# Patient Record
Sex: Female | Born: 1960 | Hispanic: No | Marital: Married | Smoking: Never smoker
Health system: Southern US, Community
[De-identification: ages and names within clinical notes are randomized; demographics above are authoritative.]

## PROBLEM LIST (undated history)

## (undated) DIAGNOSIS — E785 Hyperlipidemia, unspecified: Secondary | ICD-10-CM

## (undated) DIAGNOSIS — I10 Essential (primary) hypertension: Secondary | ICD-10-CM

## (undated) HISTORY — DX: Hyperlipidemia, unspecified: E78.5

## (undated) HISTORY — PX: ENDOMETRIAL ABLATION: SHX621

## (undated) HISTORY — PX: DILATION AND CURETTAGE OF UTERUS: SHX78

## (undated) HISTORY — PX: PLACEMENT OF BREAST IMPLANTS: SHX6334

## (undated) HISTORY — DX: Essential (primary) hypertension: I10

---

## 1999-11-19 ENCOUNTER — Other Ambulatory Visit: Admission: RE | Admit: 1999-11-19 | Discharge: 1999-11-19 | Payer: Self-pay | Admitting: Family Medicine

## 2000-03-04 ENCOUNTER — Encounter: Payer: Self-pay | Admitting: Family Medicine

## 2000-03-04 ENCOUNTER — Encounter: Admission: RE | Admit: 2000-03-04 | Discharge: 2000-03-04 | Payer: Self-pay | Admitting: Family Medicine

## 2001-08-10 ENCOUNTER — Other Ambulatory Visit: Admission: RE | Admit: 2001-08-10 | Discharge: 2001-08-10 | Payer: Self-pay | Admitting: Family Medicine

## 2002-09-16 ENCOUNTER — Other Ambulatory Visit: Admission: RE | Admit: 2002-09-16 | Discharge: 2002-09-16 | Payer: Self-pay | Admitting: Family Medicine

## 2008-08-27 ENCOUNTER — Ambulatory Visit (HOSPITAL_COMMUNITY): Admission: RE | Admit: 2008-08-27 | Discharge: 2008-08-27 | Payer: Self-pay | Admitting: Orthopedic Surgery

## 2019-06-22 ENCOUNTER — Encounter: Payer: Self-pay | Admitting: Orthopedic Surgery

## 2019-06-22 ENCOUNTER — Ambulatory Visit: Payer: Self-pay

## 2019-06-22 ENCOUNTER — Ambulatory Visit (INDEPENDENT_AMBULATORY_CARE_PROVIDER_SITE_OTHER): Payer: BLUE CROSS/BLUE SHIELD | Admitting: Orthopedic Surgery

## 2019-06-22 DIAGNOSIS — M5412 Radiculopathy, cervical region: Secondary | ICD-10-CM

## 2019-06-22 MED ORDER — PREDNISONE 5 MG (21) PO TBPK: ORAL_TABLET | ORAL | 0 refills | Status: DC

## 2019-06-22 MED ORDER — METHOCARBAMOL 500 MG PO TABS: 500.0000 mg | ORAL_TABLET | Freq: Three times a day (TID) | ORAL | 0 refills | Status: DC | PRN

## 2019-06-23 ENCOUNTER — Encounter: Payer: Self-pay | Admitting: Orthopedic Surgery

## 2019-06-23 ENCOUNTER — Telehealth: Payer: Self-pay | Admitting: Orthopedic Surgery

## 2019-06-23 NOTE — Progress Notes (Signed)
Office Visit Note   Patient: Barbara Glenn A Rollyson           Date of Birth: 05/29/1961           MRN: [id number] Visit Date: 06/22/2019 Requested by: No referring provider defined for this encounter. PCP: System, Pcp Not In  Subjective: Chief Complaint  Patient presents with  . Neck - Pain    HPI: Barbara Glenn is a patient with neck and shoulder pain.  Been going on for about a month.  Denies any history of injury.  She reports radicular arm pain on the left-hand side.  She is right-hand dominant.  The pain does wake her from sleep at night.  She had the same symptoms about 6 years ago.  That MRI scan of her cervical spine is unavailable.  That did improve with physical therapy and Mobic.  Denies any decreased range of motion.  She does describe relief with raising her left arm over her head.  Takes Aleve as needed.  She has had 5 visits with physical therapy.  She cannot exercise.              ROS: All systems reviewed are negative as they relate to the chief complaint within the history of present illness.  Patient denies  fevers or chills.   Assessment & Plan: Visit Diagnoses:  1. Cervical radiculopathy     Plan: Impression is recurrent left-sided radiculopathy.  Similar problem 6 years ago resolved with conservative treatment.  No weakness today but does have classic radicular symptoms.  Degenerative changes present in the cervical spine.  Plan Medrol Dosepak 6-day course with Robaxin as well.  I like her to call me in 2 weeks and we could consider further imaging at that time if she develops weakness or intractable pain.  She does have very slight weakness on finger extension and shoulder external rotation on the left versus right but no paresthesias C5-T1.  Follow-Up Instructions: Return if symptoms worsen or fail to improve.   Orders:  Orders Placed This Encounter  Procedures  . XR Cervical Spine 2 or 3 views   Meds ordered this encounter  Medications  . predniSONE (STERAPRED UNI-PAK 21  TAB) 5 MG (21) TBPK tablet    Sig: Take dosepak as directed    Dispense:  21 tablet    Refill:  0  . methocarbamol (ROBAXIN) 500 MG tablet    Sig: Take 1 tablet (500 mg total) by mouth every 8 (eight) hours as needed for muscle spasms.    Dispense:  30 tablet    Refill:  0      Procedures: No procedures performed   Clinical Data: No additional findings.  Objective: Vital Signs: There were no vitals taken for this visit.  Physical Exam:   Constitutional: Patient appears well-developed HEENT:  Head: Normocephalic Eyes:EOM are normal Neck: Normal range of motion Cardiovascular: Normal rate Pulmonary/chest: Effort normal Neurologic: Patient is alert Skin: Skin is warm Psychiatric: Patient has normal mood and affect    Ortho Exam: Ortho exam demonstrates normal gait alignment.  Palpable radial pulses bilaterally.  Pretty good cervical spine range of motion but she does have some pain with rotation to the left which radiates into the trapezial region.  Excellent shoulder range of motion with slight 5- out of 5 weakness to external rotation on the right compared to the left.  No other masses lymphadenopathy or skin changes noted in that shoulder girdle region.  Slight weakness to finger extension on  the left compared to the right.  No muscle atrophy left arm versus right.  Specialty Comments:  No specialty comments available.  Imaging: No results found.   PMFS History: There are no active problems to display for this patient.  History reviewed. No pertinent past medical history.  History reviewed. No pertinent family history.  History reviewed. No pertinent surgical history. Social History   Occupational History  . Not on file  Tobacco Use  . Smoking status: Not on file  Substance and Sexual Activity  . Alcohol use: Not on file  . Drug use: Not on file  . Sexual activity: Not on file

## 2019-06-23 NOTE — Telephone Encounter (Signed)
Pt called in said he had a question for dr.dean said should she continue doing physical therapy.   [phone]

## 2019-06-23 NOTE — Telephone Encounter (Signed)
Okay to start therapy 3 days after finishing the Medrol Dosepak please call thanks also please remind her to call me in 2 weeks to let me know if she is better and if she is then we can continue if she is not then we will get MRI scan on cervical spine.  All this was discussed in the clinic visit but I am not sure if she remembers it.

## 2019-06-23 NOTE — Telephone Encounter (Signed)
Patient asking if she should continue with therapy. Please advise. Thanks.

## 2019-06-24 NOTE — Telephone Encounter (Signed)
Tried calling. No answer. LMVM advising per Dr Dean.  

## 2019-07-07 ENCOUNTER — Telehealth: Payer: Self-pay | Admitting: Orthopedic Surgery

## 2019-07-07 DIAGNOSIS — M542 Cervicalgia: Secondary | ICD-10-CM

## 2019-07-07 NOTE — Telephone Encounter (Signed)
Pt called in said dr.dean told her to call in if she wasn't feeling better and pt says she is still in pain on left shoulder/upper back.  Please give her a call she would like to know what she could do next.  [phone]

## 2019-07-07 NOTE — Telephone Encounter (Signed)
Please advise. Last note said potentially discuss further imaging.

## 2019-07-08 MED ORDER — METHOCARBAMOL 500 MG PO TABS: 500.0000 mg | ORAL_TABLET | Freq: Three times a day (TID) | ORAL | 0 refills | Status: DC | PRN

## 2019-07-08 NOTE — Telephone Encounter (Signed)
I sent in refill for robaxin. She was out. She asked for refill on prednisone also but advised typically do not refill that, especially close together.

## 2019-07-08 NOTE — Addendum Note (Signed)
Addended byLaurann Montana on: 07/08/2019 01:15 PM   Modules accepted: Orders

## 2019-07-08 NOTE — Telephone Encounter (Signed)
Needs mri c spine p[ls call thx

## 2019-07-11 NOTE — Telephone Encounter (Signed)
No rf on predinisone thx

## 2019-07-15 ENCOUNTER — Other Ambulatory Visit: Payer: Self-pay

## 2019-07-15 ENCOUNTER — Ambulatory Visit
Admission: RE | Admit: 2019-07-15 | Discharge: 2019-07-15 | Disposition: A | Discharge status: Home / Self Care | Payer: BLUE CROSS/BLUE SHIELD | Source: Ambulatory Visit | Attending: Orthopedic Surgery | Admitting: Orthopedic Surgery

## 2019-07-15 DIAGNOSIS — M542 Cervicalgia: Secondary | ICD-10-CM

## 2019-07-19 ENCOUNTER — Telehealth: Payer: Self-pay | Admitting: Orthopedic Surgery

## 2019-07-19 NOTE — Telephone Encounter (Signed)
Called patient left message to return call to schedule an appointment for MRI review 

## 2019-07-28 ENCOUNTER — Ambulatory Visit (INDEPENDENT_AMBULATORY_CARE_PROVIDER_SITE_OTHER): Payer: BLUE CROSS/BLUE SHIELD | Admitting: Orthopedic Surgery

## 2019-07-28 ENCOUNTER — Encounter: Payer: Self-pay | Admitting: Orthopedic Surgery

## 2019-07-28 DIAGNOSIS — M792 Neuralgia and neuritis, unspecified: Secondary | ICD-10-CM | POA: Diagnosis not present

## 2019-07-29 ENCOUNTER — Telehealth: Payer: Self-pay | Admitting: *Deleted

## 2019-07-29 ENCOUNTER — Encounter: Payer: Self-pay | Admitting: Orthopedic Surgery

## 2019-07-29 NOTE — Progress Notes (Signed)
   Office Visit Note   Patient: Barbara Glenn           Date of Birth: 07/07/1961           MRN: [id number] Visit Date: 07/28/2019 Requested by: No referring provider defined for this encounter. PCP: System, Pcp Not In  Subjective: Chief Complaint  Patient presents with  . Follow-up    HPI: Reis is a patient here for review of MRI scan.  Prednisone taper did help her.  She is been in therapy doing dry needling and neck stretching.  MRI scan shows fairly significant C5-6 bilateral foraminal narrowing as well as a disc herniation.  C6-7 has smaller disc herniation.  Scans are reviewed with the patient.  Patient will have pain which occasionally wakes her up at night.  Really hard for her to exercise or walk.  With the arm down on the left her symptoms are worse with the arm up it is better.              ROS: All systems reviewed are negative as they relate to the chief complaint within the history of present illness.  Patient denies  fevers or chills.   Assessment & Plan: Visit Diagnoses:  1. Radicular pain in left arm     Plan: Impression is cervical spine radiculopathy with 1 level definitively involved possible second level partially involved plan is referral to Dr. Ernestina Patches for C-spine ESI.  I think she is failed conservative measures.  She is had this for several years and did Well with therapy before.  Symptoms have recurred.  Shots would be a good way to buy some time to see if the disc in become smaller  Follow-Up Instructions: No follow-ups on file.   Orders:  Orders Placed This Encounter  Procedures  . Ambulatory referral to Physical Medicine Rehab   No orders of the defined types were placed in this encounter.     Procedures: No procedures performed   Clinical Data: No additional findings.  Objective: Vital Signs: There were no vitals taken for this visit.  Physical Exam:   Constitutional: Patient appears well-developed HEENT:  Head: Normocephalic Eyes:EOM  are normal Neck: Normal range of motion Cardiovascular: Normal rate Pulmonary/chest: Effort normal Neurologic: Patient is alert Skin: Skin is warm Psychiatric: Patient has normal mood and affect    Ortho Exam: Ortho exam demonstrates full active and passive range of motion of the neck.  She has 5 out of 5 grip EPL FPL interosseous wrist flexion extension bicep triceps and deltoid strength.  Radial pulse intact bilaterally.  No real weakness in the arm.  Shoulder exam on the left is normal.  No paresthesias C5-T1.  Specialty Comments:  No specialty comments available.  Imaging: No results found.   PMFS History: There are no active problems to display for this patient.  History reviewed. No pertinent past medical history.  History reviewed. No pertinent family history.  History reviewed. No pertinent surgical history. Social History   Occupational History  . Not on file  Tobacco Use  . Smoking status: Never Smoker  . Smokeless tobacco: Never Used  Substance and Sexual Activity  . Alcohol use: Not on file  . Drug use: Not on file  . Sexual activity: Not on file

## 2019-08-01 ENCOUNTER — Ambulatory Visit: Payer: BLUE CROSS/BLUE SHIELD | Admitting: Orthopedic Surgery

## 2019-08-03 ENCOUNTER — Other Ambulatory Visit: Payer: Self-pay | Admitting: Physical Medicine and Rehabilitation

## 2019-08-03 DIAGNOSIS — F411 Generalized anxiety disorder: Secondary | ICD-10-CM

## 2019-08-03 MED ORDER — DIAZEPAM 5 MG PO TABS: ORAL_TABLET | ORAL | 0 refills | Status: DC

## 2019-08-03 NOTE — Telephone Encounter (Signed)
done

## 2019-08-03 NOTE — Progress Notes (Signed)
Pre-procedure diazepam ordered for pre-operative anxiety.  

## 2019-08-11 ENCOUNTER — Telehealth: Payer: Self-pay | Admitting: Orthopedic Surgery

## 2019-08-11 ENCOUNTER — Other Ambulatory Visit: Payer: Self-pay | Admitting: Surgical

## 2019-08-11 MED ORDER — METHOCARBAMOL 500 MG PO TABS: 500.0000 mg | ORAL_TABLET | Freq: Three times a day (TID) | ORAL | 0 refills | Status: DC | PRN

## 2019-08-11 NOTE — Telephone Encounter (Signed)
Can you advise please  

## 2019-08-11 NOTE — Telephone Encounter (Signed)
Patient called requesting an RX refill on her Methocarbamol.  Patient uses [facility] at the corner of [address].  CB[phone].  Thank you.

## 2019-08-24 ENCOUNTER — Encounter: Payer: BLUE CROSS/BLUE SHIELD | Admitting: Physical Medicine and Rehabilitation

## 2019-09-01 ENCOUNTER — Ambulatory Visit: Payer: Self-pay

## 2019-09-01 ENCOUNTER — Ambulatory Visit (INDEPENDENT_AMBULATORY_CARE_PROVIDER_SITE_OTHER): Payer: BLUE CROSS/BLUE SHIELD | Admitting: Physical Medicine and Rehabilitation

## 2019-09-01 ENCOUNTER — Encounter: Payer: Self-pay | Admitting: Physical Medicine and Rehabilitation

## 2019-09-01 ENCOUNTER — Other Ambulatory Visit: Payer: Self-pay

## 2019-09-01 VITALS — BP 136/84 | HR 76

## 2019-09-01 DIAGNOSIS — M5412 Radiculopathy, cervical region: Secondary | ICD-10-CM

## 2019-09-01 MED ORDER — METHYLPREDNISOLONE ACETATE 80 MG/ML IJ SUSP
80.0000 mg | Freq: Once | INTRAMUSCULAR | Status: AC
  Administered 2019-09-01: 80 mg

## 2019-09-01 NOTE — Progress Notes (Signed)
 .  Numeric Pain Rating Scale and Functional Assessment Average Pain 5   In the last MONTH (on 0-10 scale) has pain interfered with the following?  1. General activity like being  able to carry out your everyday physical activities such as walking, climbing stairs, carrying groceries, or moving a chair?  Rating(6)   +Driver, -BT, -Dye Allergies.  

## 2019-09-19 ENCOUNTER — Other Ambulatory Visit: Payer: Self-pay | Admitting: Surgical

## 2019-09-19 ENCOUNTER — Telehealth: Payer: Self-pay | Admitting: Orthopedic Surgery

## 2019-09-19 NOTE — Telephone Encounter (Signed)
Please advise. Thanks.  

## 2019-09-19 NOTE — Telephone Encounter (Signed)
Patient called to get refill of Methcarbamol.  Please call patient @ [phone]

## 2019-09-20 ENCOUNTER — Other Ambulatory Visit: Payer: Self-pay | Admitting: Surgical

## 2019-09-20 MED ORDER — METHOCARBAMOL 500 MG PO TABS: 500.0000 mg | ORAL_TABLET | Freq: Three times a day (TID) | ORAL | 0 refills | Status: DC | PRN

## 2019-09-20 NOTE — Telephone Encounter (Signed)
IC s/w patient and advised  

## 2019-11-21 NOTE — Procedures (Signed)
Cervical Epidural Steroid Injection - Interlaminar Approach with Fluoroscopic Guidance  Patient: Barbara Glenn      Date of Birth: [birth year] MRN: [id number] PCP: System, Pcp Not In      Visit Date: 09/01/2019   Universal Protocol:    Date/Time: 11/20/2110:42 PM  Consent Given By: the patient  Position: PRONE  Additional Comments: Vital signs were monitored before and after the procedure. Patient was prepped and draped in the usual sterile fashion. The correct patient, procedure, and site was verified.   Injection Procedure Details:  Procedure Site One Meds Administered:  Meds ordered this encounter  Medications  . methylPREDNISolone acetate (DEPO-MEDROL) injection 80 mg     Laterality: Left  Location/Site: C7-T1  Needle size: 20 G  Needle type: Touhy  Needle Placement: Paramedian epidural space  Findings:  -Comments: Excellent flow of contrast into the epidural space.  Procedure Details: Using a paramedian approach from the side mentioned above, the region overlying the inferior lamina was localized under fluoroscopic visualization and the soft tissues overlying this structure were infiltrated with 4 ml. of 1% Lidocaine without Epinephrine. A # 20 gauge, Tuohy needle was inserted into the epidural space using a paramedian approach.  The epidural space was localized using loss of resistance along with lateral and contralateral oblique bi-planar fluoroscopic views.  After negative aspirate for air, blood, and CSF, a 2 ml. volume of Isovue-250 was injected into the epidural space and the flow of contrast was observed. Radiographs were obtained for documentation purposes.   The injectate was administered into the level noted above.  Additional Comments:  The patient tolerated the procedure well Dressing: 2 x 2 sterile gauze and Band-Aid    Post-procedure details: Patient was observed during the procedure. Post-procedure instructions were reviewed.  Patient left  the clinic in stable condition.

## 2019-11-21 NOTE — Progress Notes (Signed)
Barbara Glenn - 59 y.o. female MRN [id number]  Date of birth: [birth year]  Office Visit Note: Visit Date: 09/01/2019 PCP: System, Pcp Not In Referred by: Meredith Pel, MD  Subjective: Chief Complaint  Patient presents with  . Neck - Pain  . Left Shoulder - Pain  . Left Arm - Pain   HPI:  Barbara Glenn is a 59 y.o. female who comes in today At the request of Dr. Anderson Malta for left C7-T1 interlaminar epidural steroid injection.  The patient has failed conservative care including home exercise, medications, time and activity modification.  This injection will be diagnostic and hopefully therapeutic.  Please see requesting physician notes for further details and justification.   ROS Otherwise per HPI.  Assessment & Plan: Visit Diagnoses:  1. Cervical radiculopathy     Plan: No additional findings.   Meds & Orders:  Meds ordered this encounter  Medications  . methylPREDNISolone acetate (DEPO-MEDROL) injection 80 mg    Orders Placed This Encounter  Procedures  . XR C-ARM NO REPORT  . Epidural Steroid injection    Follow-up: Return for visit to requesting physician as needed.   Procedures: No procedures performed  Cervical Epidural Steroid Injection - Interlaminar Approach with Fluoroscopic Guidance  Patient: Barbara Glenn      Date of Birth: October 18, 1961 MRN: [id number] PCP: System, Pcp Not In      Visit Date: 09/01/2019   Universal Protocol:    Date/Time: 11/20/2110:42 PM  Consent Given By: the patient  Position: PRONE  Additional Comments: Vital signs were monitored before and after the procedure. Patient was prepped and draped in the usual sterile fashion. The correct patient, procedure, and site was verified.   Injection Procedure Details:  Procedure Site One Meds Administered:  Meds ordered this encounter  Medications  . methylPREDNISolone acetate (DEPO-MEDROL) injection 80 mg     Laterality: Left  Location/Site: C7-T1  Needle size: 20  G  Needle type: Touhy  Needle Placement: Paramedian epidural space  Findings:  -Comments: Excellent flow of contrast into the epidural space.  Procedure Details: Using a paramedian approach from the side mentioned above, the region overlying the inferior lamina was localized under fluoroscopic visualization and the soft tissues overlying this structure were infiltrated with 4 ml. of 1% Lidocaine without Epinephrine. A # 20 gauge, Tuohy needle was inserted into the epidural space using a paramedian approach.  The epidural space was localized using loss of resistance along with lateral and contralateral oblique bi-planar fluoroscopic views.  After negative aspirate for air, blood, and CSF, a 2 ml. volume of Isovue-250 was injected into the epidural space and the flow of contrast was observed. Radiographs were obtained for documentation purposes.   The injectate was administered into the level noted above.  Additional Comments:  The patient tolerated the procedure well Dressing: 2 x 2 sterile gauze and Band-Aid    Post-procedure details: Patient was observed during the procedure. Post-procedure instructions were reviewed.  Patient left the clinic in stable condition.     Clinical History: MRI CERVICAL SPINE WITHOUT CONTRAST  TECHNIQUE: Multiplanar, multisequence MR imaging of the cervical spine was performed. No intravenous contrast was administered.  COMPARISON:  09/21/2013  FINDINGS: Alignment: Physiologic  Vertebrae: No fracture, evidence of discitis, or bone lesion.  Cord: Normal signal and morphology.  Posterior Fossa, vertebral arteries, paraspinal tissues: Posterior fossa demonstrates no focal abnormality. Vertebral artery flow voids are maintained. Paraspinal soft tissues are unremarkable.  Disc levels:  Discs:  Degenerative disease with disc height loss at C5-6 and C6-7.  C2-3: No significant disc bulge. No neural foraminal stenosis. No central  canal stenosis.  C3-4: No significant disc bulge. Mild bilateral facet arthropathy. No foraminal stenosis. No central canal stenosis.  C4-5: Mild broad-based disc bulge. Mild left foraminal narrowing. No right foraminal narrowing. No central canal stenosis.  C5-6: Broad-based disc osteophyte complex. Bilateral uncovertebral degenerative changes. Severe bilateral foraminal stenosis. Mild spinal stenosis.  C6-7: Broad-based disc bulge. Bilateral uncovertebral degenerative changes. Moderate right and severe left foraminal stenosis. No central canal stenosis.  C7-T1: No significant disc bulge. No neural foraminal stenosis. No central canal stenosis.  IMPRESSION: 1. At C5-6 there is a broad-based disc osteophyte complex. Bilateral uncovertebral degenerative changes. Severe bilateral foraminal stenosis. Mild spinal stenosis. 2. At C6-7 there is a broad-based disc bulge. Bilateral uncovertebral degenerative changes. Moderate right and severe left foraminal stenosis.   Electronically Signed   By: Elige Ko   On: 07/16/2019 10:03     Objective:  VS:  HT:    WT:   BMI:     BP:136/84  HR:76bpm  TEMP: ( )  RESP:  Physical Exam  Ortho Exam Imaging: No results found.

## 2019-12-30 DIAGNOSIS — J3089 Other allergic rhinitis: Secondary | ICD-10-CM | POA: Diagnosis not present

## 2020-01-03 DIAGNOSIS — J3089 Other allergic rhinitis: Secondary | ICD-10-CM | POA: Diagnosis not present

## 2020-01-13 DIAGNOSIS — J3089 Other allergic rhinitis: Secondary | ICD-10-CM | POA: Diagnosis not present

## 2020-01-19 DIAGNOSIS — J3089 Other allergic rhinitis: Secondary | ICD-10-CM | POA: Diagnosis not present

## 2020-01-25 DIAGNOSIS — J3089 Other allergic rhinitis: Secondary | ICD-10-CM | POA: Diagnosis not present

## 2020-02-06 DIAGNOSIS — I10 Essential (primary) hypertension: Secondary | ICD-10-CM | POA: Diagnosis not present

## 2020-02-06 DIAGNOSIS — F411 Generalized anxiety disorder: Secondary | ICD-10-CM | POA: Diagnosis not present

## 2020-02-06 DIAGNOSIS — G4700 Insomnia, unspecified: Secondary | ICD-10-CM | POA: Diagnosis not present

## 2020-02-06 DIAGNOSIS — F331 Major depressive disorder, recurrent, moderate: Secondary | ICD-10-CM | POA: Diagnosis not present

## 2020-02-08 DIAGNOSIS — J3089 Other allergic rhinitis: Secondary | ICD-10-CM | POA: Diagnosis not present

## 2020-02-16 DIAGNOSIS — J3089 Other allergic rhinitis: Secondary | ICD-10-CM | POA: Diagnosis not present

## 2020-02-20 DIAGNOSIS — J3089 Other allergic rhinitis: Secondary | ICD-10-CM | POA: Diagnosis not present

## 2020-02-23 DIAGNOSIS — J3089 Other allergic rhinitis: Secondary | ICD-10-CM | POA: Diagnosis not present

## 2020-03-01 DIAGNOSIS — E782 Mixed hyperlipidemia: Secondary | ICD-10-CM | POA: Diagnosis not present

## 2020-03-01 DIAGNOSIS — Z0000 Encounter for general adult medical examination without abnormal findings: Secondary | ICD-10-CM | POA: Diagnosis not present

## 2020-03-01 DIAGNOSIS — J3089 Other allergic rhinitis: Secondary | ICD-10-CM | POA: Diagnosis not present

## 2020-03-01 DIAGNOSIS — Z0184 Encounter for antibody response examination: Secondary | ICD-10-CM | POA: Diagnosis not present

## 2020-03-06 DIAGNOSIS — Z1211 Encounter for screening for malignant neoplasm of colon: Secondary | ICD-10-CM | POA: Diagnosis not present

## 2020-03-06 DIAGNOSIS — Z01419 Encounter for gynecological examination (general) (routine) without abnormal findings: Secondary | ICD-10-CM | POA: Diagnosis not present

## 2020-03-06 DIAGNOSIS — Z0000 Encounter for general adult medical examination without abnormal findings: Secondary | ICD-10-CM | POA: Diagnosis not present

## 2020-03-06 DIAGNOSIS — Z6824 Body mass index (BMI) 24.0-24.9, adult: Secondary | ICD-10-CM | POA: Diagnosis not present

## 2020-03-06 DIAGNOSIS — J3089 Other allergic rhinitis: Secondary | ICD-10-CM | POA: Diagnosis not present

## 2020-03-09 DIAGNOSIS — J3089 Other allergic rhinitis: Secondary | ICD-10-CM | POA: Diagnosis not present

## 2020-03-13 DIAGNOSIS — J3089 Other allergic rhinitis: Secondary | ICD-10-CM | POA: Diagnosis not present

## 2020-03-13 IMAGING — MR MR CERVICAL SPINE W/O CM
4 of 5 series · 24 of 48 positions shown · non-contrast
Comparison: 09/21/2013

CLINICAL DATA: Neck pain radiating to the left shoulder and down
the left arm.

EXAM:
MRI CERVICAL SPINE WITHOUT CONTRAST
TECHNIQUE: Multiplanar, multisequence MR imaging of the cervical spine was
performed. No intravenous contrast was administered.

[Series 3: T2 post-contrast · sagittal · 3.3mm · 0.37mm/px · 7 of 13 slices shown]
[im 1/13]
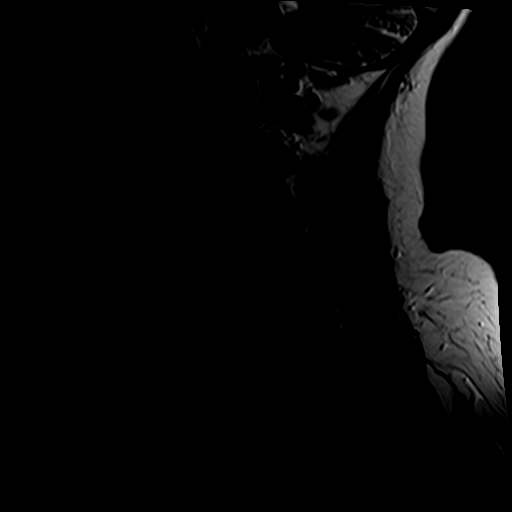
[im 3/13]
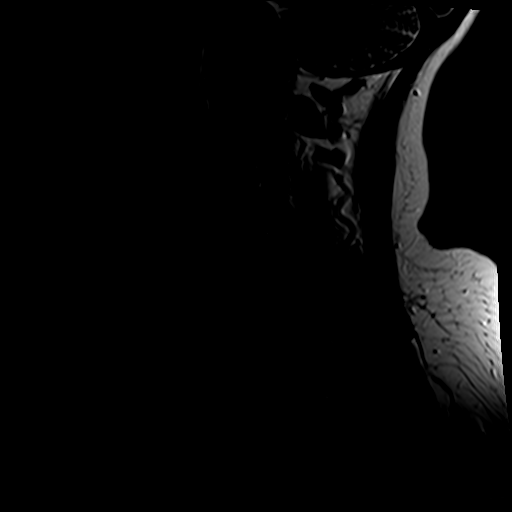
[im 5/13]
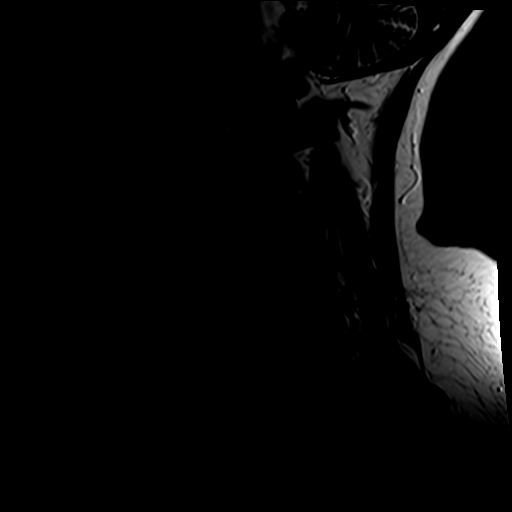
[im 7/13]
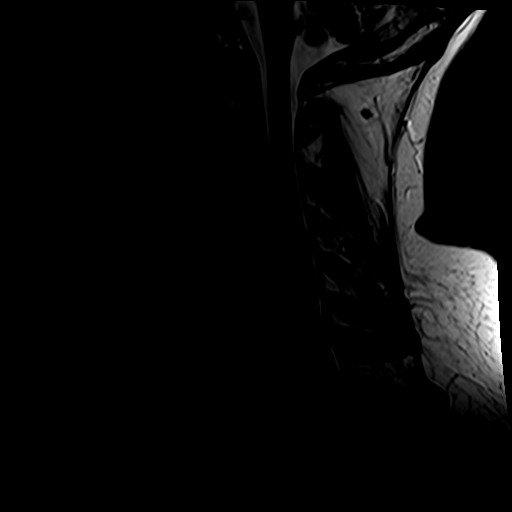
[im 9/13]
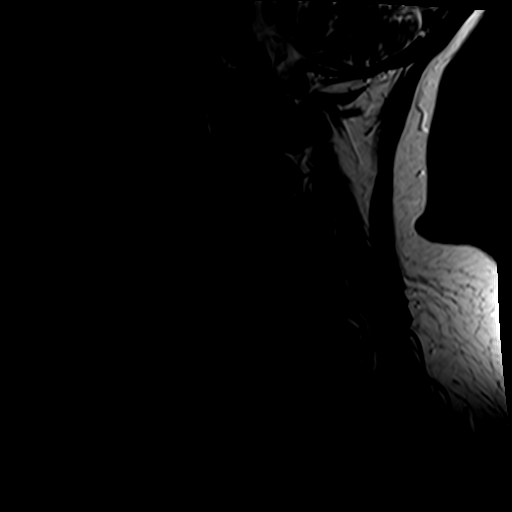
[im 11/13]
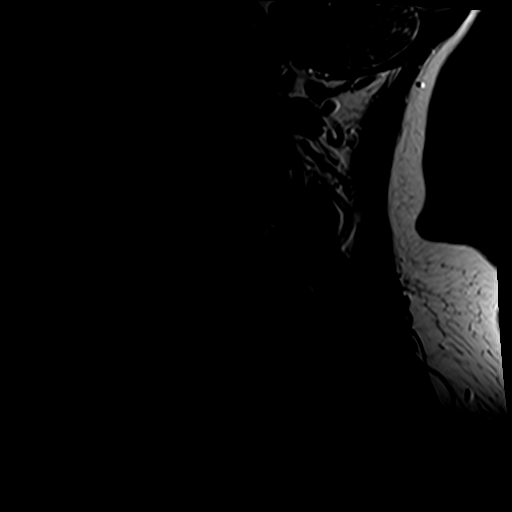
[im 13/13]
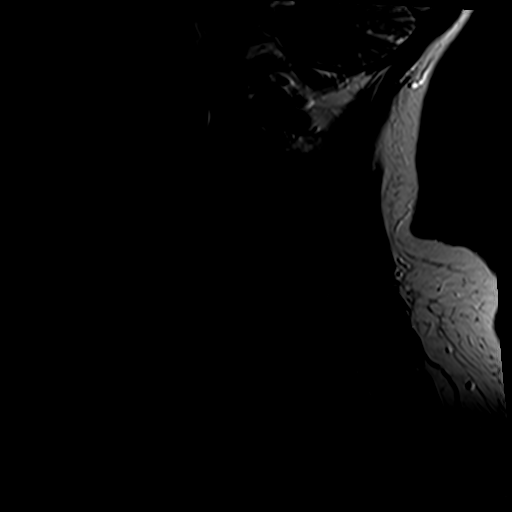

[Series 4: T1 · sagittal · 3.3mm · 0.37mm/px · 6 of 13 slices shown]
[im 1/13]
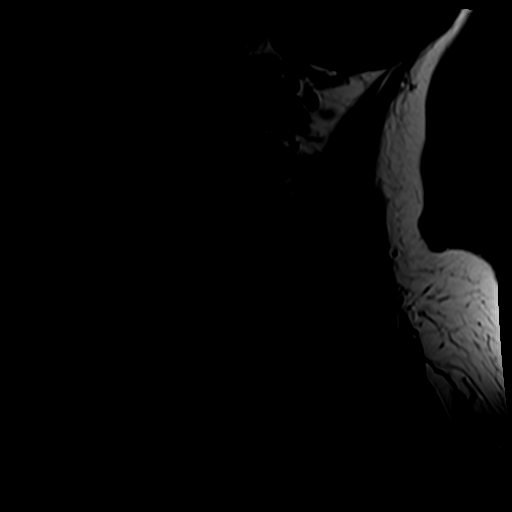
[im 3/13]
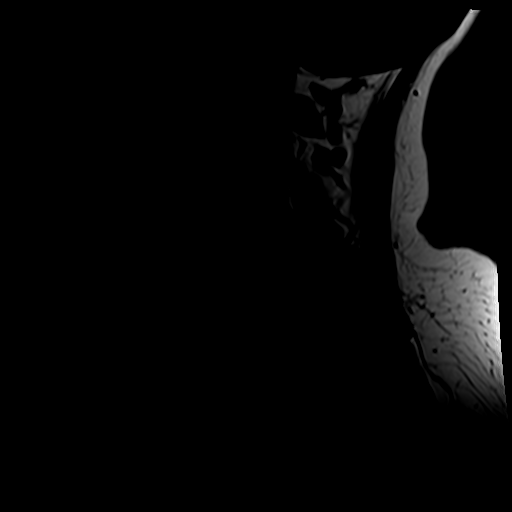
[im 5/13]
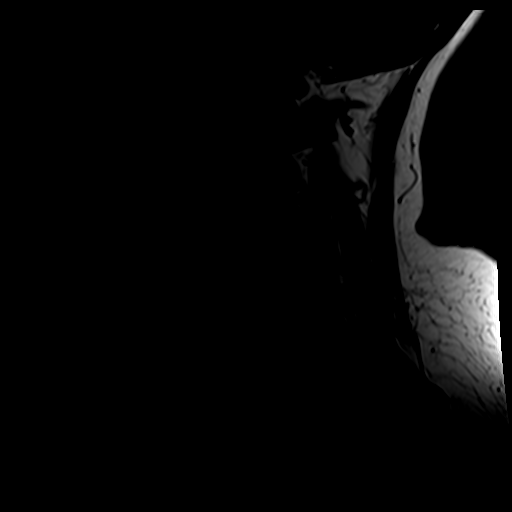
[im 7/13]
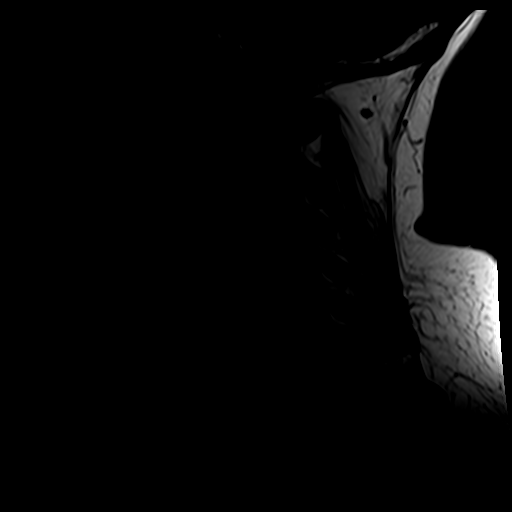
[im 9/13]
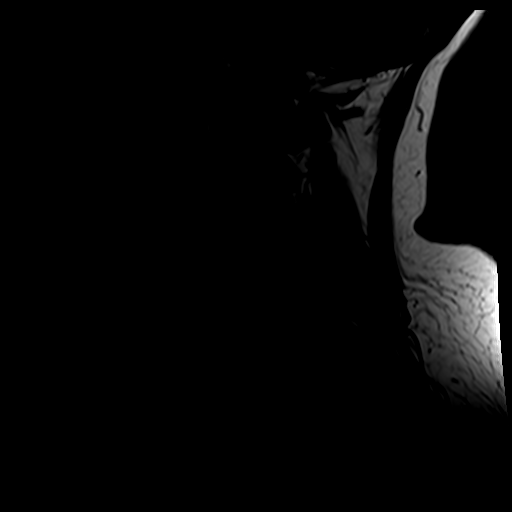
[im 11/13]
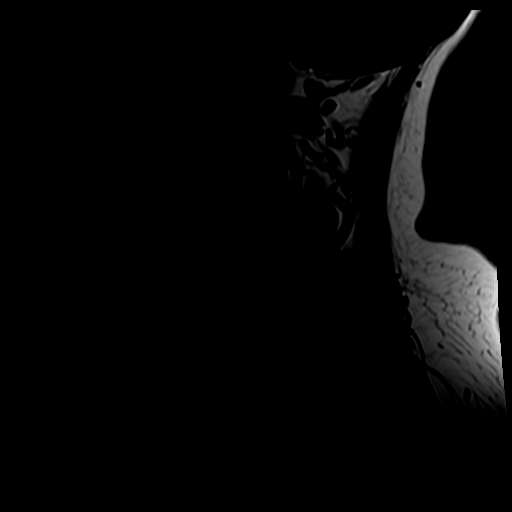

[Series 5: tir sag · sagittal · 3.3mm · 0.37mm/px · 3 of 13 slices shown]
[im 3/13]
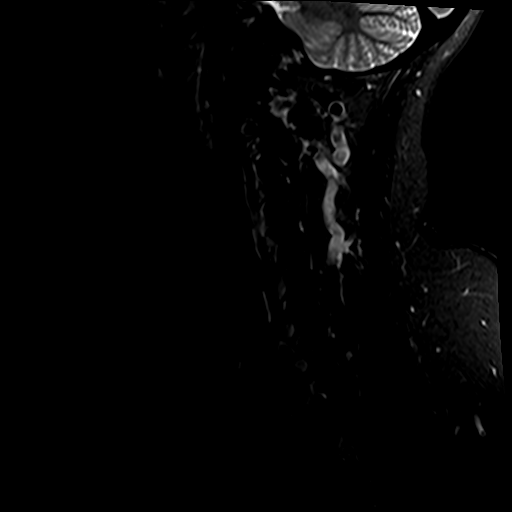
[im 8/13]
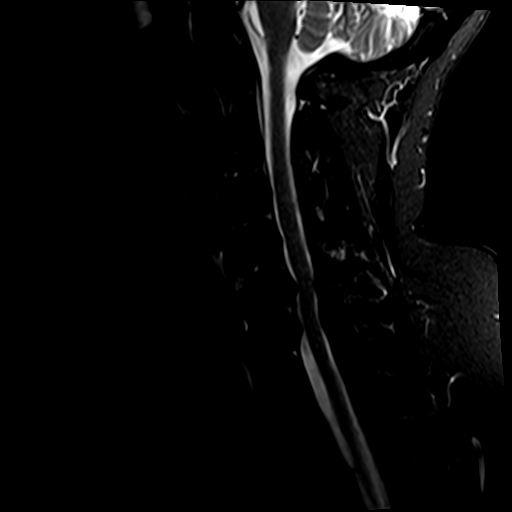
[im 13/13]
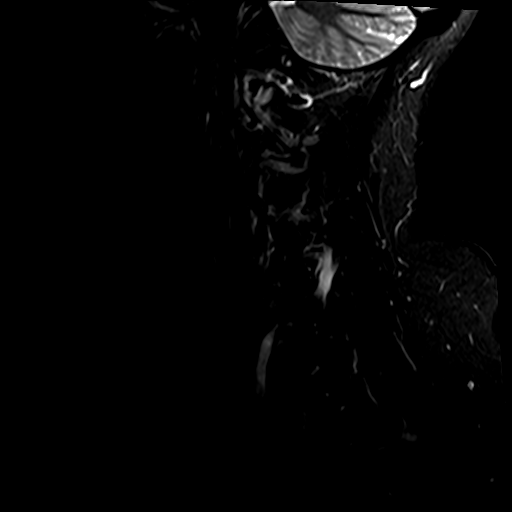

[Series 7: T2 · axial · 3.0mm · 0.70mm/px · z∈[-86,+17]mm · 8 of 29 slices shown]
[im 1/29]
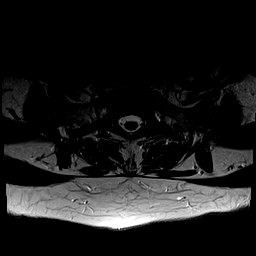
[im 5/29]
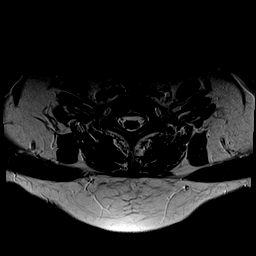
[im 9/29]
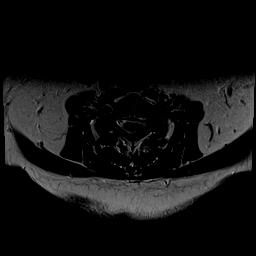
[im 13/29]
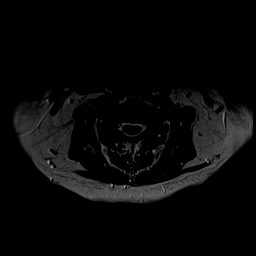
[im 16/29]
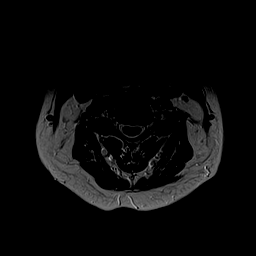
[im 20/29]
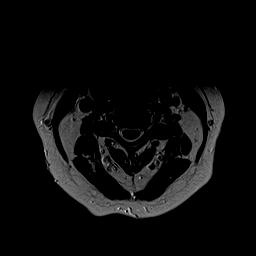
[im 24/29]
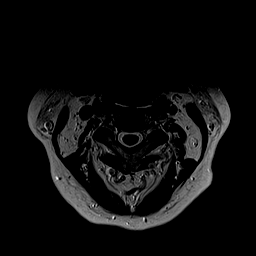
[im 29/29]
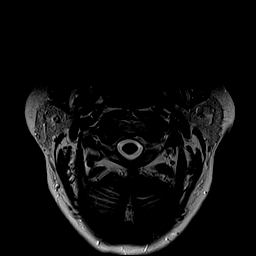

[24 of 48 positions shown; findings below may reference images not displayed]

FINDINGS: Alignment: Physiologic

Vertebrae: No fracture, evidence of discitis, or bone lesion.

Cord: Normal signal and morphology.

Posterior Fossa, vertebral arteries, paraspinal tissues: Posterior
fossa demonstrates no focal abnormality. Vertebral artery flow voids
are maintained. Paraspinal soft tissues are unremarkable.

Disc levels:

Discs: Degenerative disease with disc height loss at C5-6 and C6-7.

C2-3: No significant disc bulge. No neural foraminal stenosis. No
central canal stenosis.

C3-4: No significant disc bulge. Mild bilateral facet arthropathy.
No foraminal stenosis. No central canal stenosis.

C4-5: Mild broad-based disc bulge. Mild left foraminal narrowing. No
right foraminal narrowing. No central canal stenosis.

C5-6: Broad-based disc osteophyte complex. Bilateral uncovertebral
degenerative changes. Severe bilateral foraminal stenosis. Mild
spinal stenosis.

C6-7: Broad-based disc bulge. Bilateral uncovertebral degenerative
changes. Moderate right and severe left foraminal stenosis. No
central canal stenosis.

C7-T1: No significant disc bulge. No neural foraminal stenosis. No
central canal stenosis.
IMPRESSION: 1. At C5-6 there is a broad-based disc osteophyte complex. Bilateral
uncovertebral degenerative changes. Severe bilateral foraminal
stenosis. Mild spinal stenosis.
2. At C6-7 there is a broad-based disc bulge. Bilateral
uncovertebral degenerative changes. Moderate right and severe left
foraminal stenosis.

## 2020-03-16 DIAGNOSIS — J3089 Other allergic rhinitis: Secondary | ICD-10-CM | POA: Diagnosis not present

## 2020-03-22 DIAGNOSIS — J3089 Other allergic rhinitis: Secondary | ICD-10-CM | POA: Diagnosis not present

## 2020-03-28 DIAGNOSIS — J3089 Other allergic rhinitis: Secondary | ICD-10-CM | POA: Diagnosis not present

## 2020-04-12 DIAGNOSIS — J3089 Other allergic rhinitis: Secondary | ICD-10-CM | POA: Diagnosis not present

## 2020-04-17 DIAGNOSIS — Z1231 Encounter for screening mammogram for malignant neoplasm of breast: Secondary | ICD-10-CM | POA: Diagnosis not present

## 2020-05-03 DIAGNOSIS — J301 Allergic rhinitis due to pollen: Secondary | ICD-10-CM | POA: Diagnosis not present

## 2020-05-03 DIAGNOSIS — J3089 Other allergic rhinitis: Secondary | ICD-10-CM | POA: Diagnosis not present

## 2020-05-31 DIAGNOSIS — J3089 Other allergic rhinitis: Secondary | ICD-10-CM | POA: Diagnosis not present

## 2020-06-06 ENCOUNTER — Telehealth: Payer: Self-pay | Admitting: Orthopedic Surgery

## 2020-06-06 DIAGNOSIS — S62306A Unspecified fracture of fifth metacarpal bone, right hand, initial encounter for closed fracture: Secondary | ICD-10-CM | POA: Diagnosis not present

## 2020-06-06 NOTE — Telephone Encounter (Signed)
Pt would like a CB from Dr.Dean in regards to a Urgent Care visit she took for her hand   [phone]

## 2020-06-07 ENCOUNTER — Telehealth: Payer: Self-pay | Admitting: Orthopedic Surgery

## 2020-06-07 NOTE — Telephone Encounter (Signed)
Pt is calling back today because she didn't hear from Dr.Dean yesterday; pt would still like a CB  [phone]

## 2020-06-07 NOTE — Telephone Encounter (Signed)
Called patient. See other note.

## 2020-06-07 NOTE — Telephone Encounter (Signed)
Tried calling patient. No answer. LMVM for her apologizing for delay.  Asked her to please call us back to further discuss concerns--Dr August Saucer in surgery this afternoon.

## 2020-06-08 ENCOUNTER — Ambulatory Visit (INDEPENDENT_AMBULATORY_CARE_PROVIDER_SITE_OTHER): Payer: BLUE CROSS/BLUE SHIELD | Admitting: Orthopedic Surgery

## 2020-06-08 ENCOUNTER — Encounter: Payer: Self-pay | Admitting: Orthopedic Surgery

## 2020-06-08 VITALS — Ht 68.0 in | Wt 162.0 lb

## 2020-06-08 DIAGNOSIS — S62326A Displaced fracture of shaft of fifth metacarpal bone, right hand, initial encounter for closed fracture: Secondary | ICD-10-CM | POA: Diagnosis not present

## 2020-06-08 DIAGNOSIS — J3089 Other allergic rhinitis: Secondary | ICD-10-CM | POA: Diagnosis not present

## 2020-06-08 NOTE — Progress Notes (Signed)
   Office Visit Note   Patient: Barbara Glenn           Date of Birth: Sep 12, 1961           MRN: [id number] Visit Date: 06/08/2020 Requested by: No referring provider defined for this encounter. PCP: System, Pcp Not In  Subjective: Chief Complaint  Patient presents with  . Right Hand - Fracture    DOI 06/04/2020    HPI: Barbara Glenn is a 59 year old right-hand-dominant patient with right hand pain. She slipped on some rocks 06/04/2020. Fell on her right hand. Was seen at [facility] where radiographs were made. Placed into an ulnar gutter splint. Denies any other orthopedic complaints.              ROS: All systems reviewed are negative as they relate to the chief complaint within the history of present illness.  Patient denies  fevers or chills.   Assessment & Plan: Visit Diagnoses: No diagnosis found.  Plan: Impression is right fifth metacarpal fracture. Plan is she has about 5 degrees rotational deformity which she would like to live with. Fracture is not really that displaced on the AP or lateral views. The splint she has is actually pretty good for immobilizing the finger with some MCP flexion. Like to see her back in 1 week next Thursday for repeat radiographs and repeat clinical check. Do not anticipate surgical intervention at this time.  Follow-Up Instructions: No follow-ups on file.   Orders:  No orders of the defined types were placed in this encounter.  No orders of the defined types were placed in this encounter.     Procedures: No procedures performed   Clinical Data: No additional findings.  Objective: Vital Signs: Ht 5\' 8"  (1.727 m)   Wt 162 lb (73.5 kg)   BMI 24.63 kg/m   Physical Exam:   Constitutional: Patient appears well-developed HEENT:  Head: Normocephalic Eyes:EOM are normal Neck: Normal range of motion Cardiovascular: Normal rate Pulmonary/chest: Effort normal Neurologic: Patient is alert Skin: Skin is warm Psychiatric: Patient has normal mood and  affect    Ortho Exam: Ortho exam demonstrates minimal pain and swelling over the fifth metacarpal. She has good combined flexion and extension at the finger. No extensor lag is present. She has about a 5 to 7 degree rotational deformity of the right small finger compared to the left. Curves a little bit more inward toward the scaphoid on the right-hand side. In extension there is no real deformity.  Specialty Comments:  No specialty comments available.  Imaging: No results found.   PMFS History: There are no problems to display for this patient.  No past medical history on file.  No family history on file.  No past surgical history on file. Social History   Occupational History  . Not on file  Tobacco Use  . Smoking status: Never Smoker  . Smokeless tobacco: Never Used  Substance and Sexual Activity  . Alcohol use: Not on file  . Drug use: Not on file  . Sexual activity: Not on file

## 2020-06-13 DIAGNOSIS — J3089 Other allergic rhinitis: Secondary | ICD-10-CM | POA: Diagnosis not present

## 2020-06-14 ENCOUNTER — Ambulatory Visit (INDEPENDENT_AMBULATORY_CARE_PROVIDER_SITE_OTHER): Payer: BLUE CROSS/BLUE SHIELD | Admitting: Orthopedic Surgery

## 2020-06-14 ENCOUNTER — Encounter: Payer: Self-pay | Admitting: Orthopedic Surgery

## 2020-06-14 ENCOUNTER — Ambulatory Visit (INDEPENDENT_AMBULATORY_CARE_PROVIDER_SITE_OTHER): Payer: BLUE CROSS/BLUE SHIELD

## 2020-06-14 VITALS — Ht 68.0 in | Wt 162.0 lb

## 2020-06-14 DIAGNOSIS — S62326A Displaced fracture of shaft of fifth metacarpal bone, right hand, initial encounter for closed fracture: Secondary | ICD-10-CM

## 2020-06-15 DIAGNOSIS — H524 Presbyopia: Secondary | ICD-10-CM | POA: Diagnosis not present

## 2020-06-16 NOTE — Progress Notes (Signed)
   Post-Op Visit Note   Patient: Barbara Glenn           Date of Birth: 1961-03-01           MRN: [id number] Visit Date: 06/14/2020 PCP: System, Pcp Not In   Assessment & Plan:  Chief Complaint:  Chief Complaint  Patient presents with  . Right Hand - Fracture, Follow-up   Visit Diagnoses:  1. Displaced fracture of shaft of fifth metacarpal bone, right hand, initial encounter for closed fracture     Plan: Barbara Glenn is a patient here to follow-up right fifth metacarpal fracture.  She has been in a removable splint.  She stayed in the brace more or less full-time since have last seen her.  On exam she has no real tenderness at the fracture site with diminished swelling.  Still has slightly less than 10 degrees rotational deformity with flexion noted on that right finger.  Radiographs show no change in fracture alignment.  Needs to come back in 14 days with recheck radiographs just to check for callus formation.  If present we may be able to take her out of that brace at that time.  No significant change in fracture alignment with only 2 to 3 mm of shortening noted on the AP view.  Follow-Up Instructions: Return in about 2 weeks (around 06/28/2020).   Orders:  Orders Placed This Encounter  Procedures  . XR Hand Complete Right   No orders of the defined types were placed in this encounter.   Imaging: No results found.  PMFS History: There are no problems to display for this patient.  No past medical history on file.  No family history on file.  No past surgical history on file. Social History   Occupational History  . Not on file  Tobacco Use  . Smoking status: Never Smoker  . Smokeless tobacco: Never Used  Substance and Sexual Activity  . Alcohol use: Not on file  . Drug use: Not on file  . Sexual activity: Not on file

## 2020-06-19 DIAGNOSIS — J3089 Other allergic rhinitis: Secondary | ICD-10-CM | POA: Diagnosis not present

## 2020-06-29 DIAGNOSIS — J3089 Other allergic rhinitis: Secondary | ICD-10-CM | POA: Diagnosis not present

## 2020-07-04 ENCOUNTER — Ambulatory Visit (INDEPENDENT_AMBULATORY_CARE_PROVIDER_SITE_OTHER): Payer: BLUE CROSS/BLUE SHIELD

## 2020-07-04 ENCOUNTER — Ambulatory Visit (INDEPENDENT_AMBULATORY_CARE_PROVIDER_SITE_OTHER): Payer: BLUE CROSS/BLUE SHIELD | Admitting: Orthopedic Surgery

## 2020-07-04 ENCOUNTER — Ambulatory Visit: Payer: BLUE CROSS/BLUE SHIELD | Admitting: Orthopedic Surgery

## 2020-07-04 DIAGNOSIS — S62326A Displaced fracture of shaft of fifth metacarpal bone, right hand, initial encounter for closed fracture: Secondary | ICD-10-CM

## 2020-07-05 DIAGNOSIS — J3089 Other allergic rhinitis: Secondary | ICD-10-CM | POA: Diagnosis not present

## 2020-07-07 ENCOUNTER — Encounter: Payer: Self-pay | Admitting: Orthopedic Surgery

## 2020-07-07 NOTE — Progress Notes (Signed)
   Post-Op Visit Note   Patient: Barbara Glenn           Date of Birth: 1961/10/18           MRN: [id number] Visit Date: 07/04/2020 PCP: System, Pcp Not In   Assessment & Plan:  Chief Complaint:  Chief Complaint  Patient presents with  . Right Hand - Follow-up, Fracture   Visit Diagnoses:  1. Displaced fracture of shaft of fifth metacarpal bone, right hand, initial encounter for closed fracture     Plan: Auri is a 59 year old patient right fifth metacarpal fracture.  On exam she has really minimal tenderness around the fifth metacarpal.  She has a little bit of pain around the fourth PIP joint.  Full extension is present against gravity with pretty reasonable strength.  Collaterals are stable.  Has fairly symmetric passive DIP flexion with the PIP joint extended similar on the right and left side.  10 degree or less rotational deformity noted which is not really noticeable except with grip strength and Lynea wants to live with that deformity.  Plan at this time is wrist brace and discontinue splint.  Follow-up in 3 weeks for clinical recheck and release.  No radiographs needed at that time unless she remains tender at the fracture site  Follow-Up Instructions: Return in about 3 weeks (around 07/25/2020).   Orders:  Orders Placed This Encounter  Procedures  . XR Hand Complete Right   No orders of the defined types were placed in this encounter.   Imaging: No results found.  PMFS History: There are no problems to display for this patient.  History reviewed. No pertinent past medical history.  History reviewed. No pertinent family history.  History reviewed. No pertinent surgical history. Social History   Occupational History  . Not on file  Tobacco Use  . Smoking status: Never Smoker  . Smokeless tobacco: Never Used  Substance and Sexual Activity  . Alcohol use: Not on file  . Drug use: Not on file  . Sexual activity: Not on file

## 2020-07-11 ENCOUNTER — Ambulatory Visit: Payer: BLUE CROSS/BLUE SHIELD | Admitting: Orthopedic Surgery

## 2020-07-12 DIAGNOSIS — J3089 Other allergic rhinitis: Secondary | ICD-10-CM | POA: Diagnosis not present

## 2020-07-19 DIAGNOSIS — J3089 Other allergic rhinitis: Secondary | ICD-10-CM | POA: Diagnosis not present

## 2020-07-19 DIAGNOSIS — J3081 Allergic rhinitis due to animal (cat) (dog) hair and dander: Secondary | ICD-10-CM | POA: Diagnosis not present

## 2020-07-25 ENCOUNTER — Encounter: Payer: Self-pay | Admitting: Orthopedic Surgery

## 2020-07-25 ENCOUNTER — Ambulatory Visit (INDEPENDENT_AMBULATORY_CARE_PROVIDER_SITE_OTHER): Payer: BLUE CROSS/BLUE SHIELD | Admitting: Orthopedic Surgery

## 2020-07-25 DIAGNOSIS — S62326A Displaced fracture of shaft of fifth metacarpal bone, right hand, initial encounter for closed fracture: Secondary | ICD-10-CM

## 2020-07-25 NOTE — Progress Notes (Signed)
   Post-Op Visit Note   Patient: Barbara Glenn           Date of Birth: Feb 14, 1961           MRN: [id number] Visit Date: 07/25/2020 PCP: Pcp, No   Assessment & Plan:  Chief Complaint:  Chief Complaint  Patient presents with  . Follow-up   Visit Diagnoses:  1. Displaced fracture of shaft of fifth metacarpal bone, right hand, initial encounter for closed fracture     Plan: Barbara Glenn is now about 7 weeks out right fifth metacarpal shaft fracture as well as soft tissue injury to the fourth PIP joint.  Reports some continued pain and achiness.  On exam she has full extension against gravity at that PIP joint digit #4.  With that PIP joint extended she has passive DIP flexion to about 70 degrees.  Minimal to no tenderness around the shaft of the fifth metacarpal.  Less than 10 degree rotational deformity present when gripping.  She still is about a centimeter away on that fourth finger from touching her fingers to her palm with gripping.  Plan at this time is to let her work this out on her own.  We talked about hand therapy but if she is not able to touch her fingertips to the palm in a month we will consider hand therapy to help with that fourth PIP joint soft tissue injury. Follow-Up Instructions: Return if symptoms worsen or fail to improve.   Orders:  No orders of the defined types were placed in this encounter.  No orders of the defined types were placed in this encounter.   Imaging: No results found.  PMFS History: There are no problems to display for this patient.  History reviewed. No pertinent past medical history.  History reviewed. No pertinent family history.  History reviewed. No pertinent surgical history. Social History   Occupational History  . Not on file  Tobacco Use  . Smoking status: Never Smoker  . Smokeless tobacco: Never Used  Substance and Sexual Activity  . Alcohol use: Not on file  . Drug use: Not on file  . Sexual activity: Not on file

## 2020-07-27 DIAGNOSIS — J3089 Other allergic rhinitis: Secondary | ICD-10-CM | POA: Diagnosis not present

## 2020-08-01 DIAGNOSIS — F411 Generalized anxiety disorder: Secondary | ICD-10-CM | POA: Diagnosis not present

## 2020-08-01 DIAGNOSIS — I10 Essential (primary) hypertension: Secondary | ICD-10-CM | POA: Diagnosis not present

## 2020-08-01 DIAGNOSIS — F331 Major depressive disorder, recurrent, moderate: Secondary | ICD-10-CM | POA: Diagnosis not present

## 2020-08-01 DIAGNOSIS — G4700 Insomnia, unspecified: Secondary | ICD-10-CM | POA: Diagnosis not present

## 2020-08-02 DIAGNOSIS — J3089 Other allergic rhinitis: Secondary | ICD-10-CM | POA: Diagnosis not present

## 2020-08-03 DIAGNOSIS — E782 Mixed hyperlipidemia: Secondary | ICD-10-CM | POA: Diagnosis not present

## 2020-08-03 DIAGNOSIS — E559 Vitamin D deficiency, unspecified: Secondary | ICD-10-CM | POA: Diagnosis not present

## 2020-08-08 DIAGNOSIS — I10 Essential (primary) hypertension: Secondary | ICD-10-CM | POA: Diagnosis not present

## 2020-08-08 DIAGNOSIS — E782 Mixed hyperlipidemia: Secondary | ICD-10-CM | POA: Diagnosis not present

## 2020-08-08 DIAGNOSIS — E559 Vitamin D deficiency, unspecified: Secondary | ICD-10-CM | POA: Diagnosis not present

## 2020-08-08 DIAGNOSIS — J3089 Other allergic rhinitis: Secondary | ICD-10-CM | POA: Diagnosis not present

## 2020-08-09 DIAGNOSIS — Z23 Encounter for immunization: Secondary | ICD-10-CM | POA: Diagnosis not present

## 2020-08-16 DIAGNOSIS — J3089 Other allergic rhinitis: Secondary | ICD-10-CM | POA: Diagnosis not present

## 2020-08-24 DIAGNOSIS — J301 Allergic rhinitis due to pollen: Secondary | ICD-10-CM | POA: Diagnosis not present

## 2020-08-24 DIAGNOSIS — J3089 Other allergic rhinitis: Secondary | ICD-10-CM | POA: Diagnosis not present

## 2020-08-30 DIAGNOSIS — J3089 Other allergic rhinitis: Secondary | ICD-10-CM | POA: Diagnosis not present

## 2020-09-04 DIAGNOSIS — J3089 Other allergic rhinitis: Secondary | ICD-10-CM | POA: Diagnosis not present

## 2020-09-06 DIAGNOSIS — J3089 Other allergic rhinitis: Secondary | ICD-10-CM | POA: Diagnosis not present

## 2020-09-19 DIAGNOSIS — J3089 Other allergic rhinitis: Secondary | ICD-10-CM | POA: Diagnosis not present

## 2020-09-26 DIAGNOSIS — L821 Other seborrheic keratosis: Secondary | ICD-10-CM | POA: Diagnosis not present

## 2020-09-26 DIAGNOSIS — L812 Freckles: Secondary | ICD-10-CM | POA: Diagnosis not present

## 2020-09-26 DIAGNOSIS — L218 Other seborrheic dermatitis: Secondary | ICD-10-CM | POA: Diagnosis not present

## 2020-09-26 DIAGNOSIS — D225 Melanocytic nevi of trunk: Secondary | ICD-10-CM | POA: Diagnosis not present

## 2020-09-27 DIAGNOSIS — J3089 Other allergic rhinitis: Secondary | ICD-10-CM | POA: Diagnosis not present

## 2020-09-27 DIAGNOSIS — I10 Essential (primary) hypertension: Secondary | ICD-10-CM | POA: Diagnosis not present

## 2020-09-27 DIAGNOSIS — J301 Allergic rhinitis due to pollen: Secondary | ICD-10-CM | POA: Diagnosis not present

## 2020-10-03 DIAGNOSIS — J3089 Other allergic rhinitis: Secondary | ICD-10-CM | POA: Diagnosis not present

## 2020-10-12 DIAGNOSIS — J3089 Other allergic rhinitis: Secondary | ICD-10-CM | POA: Diagnosis not present

## 2020-10-17 DIAGNOSIS — J3089 Other allergic rhinitis: Secondary | ICD-10-CM | POA: Diagnosis not present

## 2020-10-17 DIAGNOSIS — J301 Allergic rhinitis due to pollen: Secondary | ICD-10-CM | POA: Diagnosis not present

## 2020-10-30 DIAGNOSIS — J301 Allergic rhinitis due to pollen: Secondary | ICD-10-CM | POA: Diagnosis not present

## 2020-10-30 DIAGNOSIS — J3089 Other allergic rhinitis: Secondary | ICD-10-CM | POA: Diagnosis not present

## 2020-11-02 DIAGNOSIS — E782 Mixed hyperlipidemia: Secondary | ICD-10-CM | POA: Diagnosis not present

## 2020-11-08 DIAGNOSIS — J3089 Other allergic rhinitis: Secondary | ICD-10-CM | POA: Diagnosis not present

## 2020-11-08 DIAGNOSIS — E1169 Type 2 diabetes mellitus with other specified complication: Secondary | ICD-10-CM | POA: Diagnosis not present

## 2020-11-08 DIAGNOSIS — J301 Allergic rhinitis due to pollen: Secondary | ICD-10-CM | POA: Diagnosis not present

## 2020-11-08 DIAGNOSIS — I10 Essential (primary) hypertension: Secondary | ICD-10-CM | POA: Diagnosis not present

## 2020-11-08 DIAGNOSIS — E782 Mixed hyperlipidemia: Secondary | ICD-10-CM | POA: Diagnosis not present

## 2020-11-08 DIAGNOSIS — N952 Postmenopausal atrophic vaginitis: Secondary | ICD-10-CM | POA: Diagnosis not present

## 2020-11-15 DIAGNOSIS — J3081 Allergic rhinitis due to animal (cat) (dog) hair and dander: Secondary | ICD-10-CM | POA: Diagnosis not present

## 2020-11-28 DIAGNOSIS — J3089 Other allergic rhinitis: Secondary | ICD-10-CM | POA: Diagnosis not present

## 2020-11-28 DIAGNOSIS — J301 Allergic rhinitis due to pollen: Secondary | ICD-10-CM | POA: Diagnosis not present

## 2020-12-07 DIAGNOSIS — J301 Allergic rhinitis due to pollen: Secondary | ICD-10-CM | POA: Diagnosis not present

## 2020-12-07 DIAGNOSIS — J3089 Other allergic rhinitis: Secondary | ICD-10-CM | POA: Diagnosis not present

## 2020-12-13 DIAGNOSIS — J3089 Other allergic rhinitis: Secondary | ICD-10-CM | POA: Diagnosis not present

## 2020-12-28 DIAGNOSIS — J301 Allergic rhinitis due to pollen: Secondary | ICD-10-CM | POA: Diagnosis not present

## 2020-12-28 DIAGNOSIS — J3089 Other allergic rhinitis: Secondary | ICD-10-CM | POA: Diagnosis not present

## 2021-01-04 DIAGNOSIS — J3089 Other allergic rhinitis: Secondary | ICD-10-CM | POA: Diagnosis not present

## 2021-01-10 DIAGNOSIS — J3089 Other allergic rhinitis: Secondary | ICD-10-CM | POA: Diagnosis not present

## 2021-01-24 DIAGNOSIS — J3089 Other allergic rhinitis: Secondary | ICD-10-CM | POA: Diagnosis not present

## 2021-01-24 DIAGNOSIS — J301 Allergic rhinitis due to pollen: Secondary | ICD-10-CM | POA: Diagnosis not present

## 2021-01-31 DIAGNOSIS — E782 Mixed hyperlipidemia: Secondary | ICD-10-CM | POA: Diagnosis not present

## 2021-01-31 DIAGNOSIS — I10 Essential (primary) hypertension: Secondary | ICD-10-CM | POA: Diagnosis not present

## 2021-01-31 DIAGNOSIS — G4700 Insomnia, unspecified: Secondary | ICD-10-CM | POA: Diagnosis not present

## 2021-01-31 DIAGNOSIS — R5383 Other fatigue: Secondary | ICD-10-CM | POA: Diagnosis not present

## 2021-02-01 DIAGNOSIS — Z23 Encounter for immunization: Secondary | ICD-10-CM | POA: Diagnosis not present

## 2021-02-05 DIAGNOSIS — J301 Allergic rhinitis due to pollen: Secondary | ICD-10-CM | POA: Diagnosis not present

## 2021-02-05 DIAGNOSIS — J3089 Other allergic rhinitis: Secondary | ICD-10-CM | POA: Diagnosis not present

## 2021-02-05 DIAGNOSIS — J3081 Allergic rhinitis due to animal (cat) (dog) hair and dander: Secondary | ICD-10-CM | POA: Diagnosis not present

## 2021-02-05 DIAGNOSIS — R21 Rash and other nonspecific skin eruption: Secondary | ICD-10-CM | POA: Diagnosis not present

## 2021-02-14 DIAGNOSIS — J301 Allergic rhinitis due to pollen: Secondary | ICD-10-CM | POA: Diagnosis not present

## 2021-02-14 DIAGNOSIS — J3089 Other allergic rhinitis: Secondary | ICD-10-CM | POA: Diagnosis not present

## 2021-02-20 DIAGNOSIS — J3089 Other allergic rhinitis: Secondary | ICD-10-CM | POA: Diagnosis not present

## 2021-02-22 DIAGNOSIS — J3089 Other allergic rhinitis: Secondary | ICD-10-CM | POA: Diagnosis not present

## 2021-03-01 DIAGNOSIS — J3089 Other allergic rhinitis: Secondary | ICD-10-CM | POA: Diagnosis not present

## 2021-03-04 DIAGNOSIS — Z0000 Encounter for general adult medical examination without abnormal findings: Secondary | ICD-10-CM | POA: Diagnosis not present

## 2021-03-04 DIAGNOSIS — E559 Vitamin D deficiency, unspecified: Secondary | ICD-10-CM | POA: Diagnosis not present

## 2021-03-07 DIAGNOSIS — Z1211 Encounter for screening for malignant neoplasm of colon: Secondary | ICD-10-CM | POA: Diagnosis not present

## 2021-03-07 DIAGNOSIS — H109 Unspecified conjunctivitis: Secondary | ICD-10-CM | POA: Diagnosis not present

## 2021-03-07 DIAGNOSIS — Z118 Encounter for screening for other infectious and parasitic diseases: Secondary | ICD-10-CM | POA: Diagnosis not present

## 2021-03-07 DIAGNOSIS — Z23 Encounter for immunization: Secondary | ICD-10-CM | POA: Diagnosis not present

## 2021-03-07 DIAGNOSIS — Z0000 Encounter for general adult medical examination without abnormal findings: Secondary | ICD-10-CM | POA: Diagnosis not present

## 2021-03-07 DIAGNOSIS — Z01419 Encounter for gynecological examination (general) (routine) without abnormal findings: Secondary | ICD-10-CM | POA: Diagnosis not present

## 2021-03-14 DIAGNOSIS — N3000 Acute cystitis without hematuria: Secondary | ICD-10-CM | POA: Diagnosis not present

## 2021-03-14 DIAGNOSIS — J3089 Other allergic rhinitis: Secondary | ICD-10-CM | POA: Diagnosis not present

## 2021-03-28 DIAGNOSIS — J3089 Other allergic rhinitis: Secondary | ICD-10-CM | POA: Diagnosis not present

## 2021-04-04 DIAGNOSIS — J301 Allergic rhinitis due to pollen: Secondary | ICD-10-CM | POA: Diagnosis not present

## 2021-04-04 DIAGNOSIS — J3089 Other allergic rhinitis: Secondary | ICD-10-CM | POA: Diagnosis not present

## 2021-04-15 DIAGNOSIS — J019 Acute sinusitis, unspecified: Secondary | ICD-10-CM | POA: Diagnosis not present

## 2021-04-15 DIAGNOSIS — J3089 Other allergic rhinitis: Secondary | ICD-10-CM | POA: Diagnosis not present

## 2021-04-15 DIAGNOSIS — J3081 Allergic rhinitis due to animal (cat) (dog) hair and dander: Secondary | ICD-10-CM | POA: Diagnosis not present

## 2021-04-15 DIAGNOSIS — R21 Rash and other nonspecific skin eruption: Secondary | ICD-10-CM | POA: Diagnosis not present

## 2021-04-30 DIAGNOSIS — J329 Chronic sinusitis, unspecified: Secondary | ICD-10-CM | POA: Diagnosis not present

## 2021-04-30 DIAGNOSIS — H6090 Unspecified otitis externa, unspecified ear: Secondary | ICD-10-CM | POA: Diagnosis not present

## 2021-04-30 DIAGNOSIS — H6690 Otitis media, unspecified, unspecified ear: Secondary | ICD-10-CM | POA: Diagnosis not present

## 2021-04-30 DIAGNOSIS — B9689 Other specified bacterial agents as the cause of diseases classified elsewhere: Secondary | ICD-10-CM | POA: Diagnosis not present

## 2021-05-08 DIAGNOSIS — H6121 Impacted cerumen, right ear: Secondary | ICD-10-CM | POA: Diagnosis not present

## 2021-05-08 DIAGNOSIS — H9190 Unspecified hearing loss, unspecified ear: Secondary | ICD-10-CM | POA: Diagnosis not present

## 2021-05-08 DIAGNOSIS — H6693 Otitis media, unspecified, bilateral: Secondary | ICD-10-CM | POA: Diagnosis not present

## 2021-05-08 DIAGNOSIS — J309 Allergic rhinitis, unspecified: Secondary | ICD-10-CM | POA: Diagnosis not present

## 2021-05-23 DIAGNOSIS — J3089 Other allergic rhinitis: Secondary | ICD-10-CM | POA: Diagnosis not present

## 2021-06-04 DIAGNOSIS — J3089 Other allergic rhinitis: Secondary | ICD-10-CM | POA: Diagnosis not present

## 2021-06-11 DIAGNOSIS — J3089 Other allergic rhinitis: Secondary | ICD-10-CM | POA: Diagnosis not present

## 2021-06-18 DIAGNOSIS — J3089 Other allergic rhinitis: Secondary | ICD-10-CM | POA: Diagnosis not present

## 2021-06-20 DIAGNOSIS — J3089 Other allergic rhinitis: Secondary | ICD-10-CM | POA: Diagnosis not present

## 2021-06-24 DIAGNOSIS — R42 Dizziness and giddiness: Secondary | ICD-10-CM | POA: Diagnosis not present

## 2021-06-24 DIAGNOSIS — Z8669 Personal history of other diseases of the nervous system and sense organs: Secondary | ICD-10-CM | POA: Diagnosis not present

## 2021-06-24 DIAGNOSIS — H6123 Impacted cerumen, bilateral: Secondary | ICD-10-CM | POA: Diagnosis not present

## 2021-06-28 DIAGNOSIS — J3089 Other allergic rhinitis: Secondary | ICD-10-CM | POA: Diagnosis not present

## 2021-07-03 DIAGNOSIS — J3089 Other allergic rhinitis: Secondary | ICD-10-CM | POA: Diagnosis not present

## 2021-07-11 DIAGNOSIS — J3089 Other allergic rhinitis: Secondary | ICD-10-CM | POA: Diagnosis not present

## 2021-07-16 DIAGNOSIS — J3089 Other allergic rhinitis: Secondary | ICD-10-CM | POA: Diagnosis not present

## 2021-07-18 DIAGNOSIS — J309 Allergic rhinitis, unspecified: Secondary | ICD-10-CM | POA: Diagnosis not present

## 2021-07-18 DIAGNOSIS — F411 Generalized anxiety disorder: Secondary | ICD-10-CM | POA: Diagnosis not present

## 2021-07-18 DIAGNOSIS — I10 Essential (primary) hypertension: Secondary | ICD-10-CM | POA: Diagnosis not present

## 2021-07-18 DIAGNOSIS — F331 Major depressive disorder, recurrent, moderate: Secondary | ICD-10-CM | POA: Diagnosis not present

## 2021-07-19 DIAGNOSIS — J3089 Other allergic rhinitis: Secondary | ICD-10-CM | POA: Diagnosis not present

## 2021-07-23 DIAGNOSIS — Z23 Encounter for immunization: Secondary | ICD-10-CM | POA: Diagnosis not present

## 2021-07-31 DIAGNOSIS — J3089 Other allergic rhinitis: Secondary | ICD-10-CM | POA: Diagnosis not present

## 2021-08-02 DIAGNOSIS — J301 Allergic rhinitis due to pollen: Secondary | ICD-10-CM | POA: Diagnosis not present

## 2021-08-02 DIAGNOSIS — J3089 Other allergic rhinitis: Secondary | ICD-10-CM | POA: Diagnosis not present

## 2021-08-09 DIAGNOSIS — J301 Allergic rhinitis due to pollen: Secondary | ICD-10-CM | POA: Diagnosis not present

## 2021-08-09 DIAGNOSIS — J3089 Other allergic rhinitis: Secondary | ICD-10-CM | POA: Diagnosis not present

## 2021-08-21 DIAGNOSIS — J3089 Other allergic rhinitis: Secondary | ICD-10-CM | POA: Diagnosis not present

## 2021-08-21 DIAGNOSIS — J301 Allergic rhinitis due to pollen: Secondary | ICD-10-CM | POA: Diagnosis not present

## 2021-08-28 DIAGNOSIS — F411 Generalized anxiety disorder: Secondary | ICD-10-CM | POA: Diagnosis not present

## 2021-08-28 DIAGNOSIS — I10 Essential (primary) hypertension: Secondary | ICD-10-CM | POA: Diagnosis not present

## 2021-08-28 DIAGNOSIS — J309 Allergic rhinitis, unspecified: Secondary | ICD-10-CM | POA: Diagnosis not present

## 2021-08-28 DIAGNOSIS — F331 Major depressive disorder, recurrent, moderate: Secondary | ICD-10-CM | POA: Diagnosis not present

## 2021-09-10 DIAGNOSIS — J3089 Other allergic rhinitis: Secondary | ICD-10-CM | POA: Diagnosis not present

## 2021-09-18 DIAGNOSIS — J3089 Other allergic rhinitis: Secondary | ICD-10-CM | POA: Diagnosis not present

## 2021-09-26 DIAGNOSIS — L821 Other seborrheic keratosis: Secondary | ICD-10-CM | POA: Diagnosis not present

## 2021-09-26 DIAGNOSIS — D225 Melanocytic nevi of trunk: Secondary | ICD-10-CM | POA: Diagnosis not present

## 2021-09-26 DIAGNOSIS — L720 Epidermal cyst: Secondary | ICD-10-CM | POA: Diagnosis not present

## 2021-09-26 DIAGNOSIS — L812 Freckles: Secondary | ICD-10-CM | POA: Diagnosis not present

## 2021-09-27 DIAGNOSIS — J3089 Other allergic rhinitis: Secondary | ICD-10-CM | POA: Diagnosis not present

## 2021-10-01 DIAGNOSIS — J3089 Other allergic rhinitis: Secondary | ICD-10-CM | POA: Diagnosis not present

## 2021-10-10 DIAGNOSIS — G4700 Insomnia, unspecified: Secondary | ICD-10-CM | POA: Diagnosis not present

## 2021-10-10 DIAGNOSIS — F331 Major depressive disorder, recurrent, moderate: Secondary | ICD-10-CM | POA: Diagnosis not present

## 2021-10-10 DIAGNOSIS — I10 Essential (primary) hypertension: Secondary | ICD-10-CM | POA: Diagnosis not present

## 2021-10-10 DIAGNOSIS — J3089 Other allergic rhinitis: Secondary | ICD-10-CM | POA: Diagnosis not present

## 2021-10-10 DIAGNOSIS — F411 Generalized anxiety disorder: Secondary | ICD-10-CM | POA: Diagnosis not present

## 2021-10-25 DIAGNOSIS — J3089 Other allergic rhinitis: Secondary | ICD-10-CM | POA: Diagnosis not present

## 2021-11-01 DIAGNOSIS — J3089 Other allergic rhinitis: Secondary | ICD-10-CM | POA: Diagnosis not present

## 2021-11-11 DIAGNOSIS — J3089 Other allergic rhinitis: Secondary | ICD-10-CM | POA: Diagnosis not present

## 2021-11-26 DIAGNOSIS — J3089 Other allergic rhinitis: Secondary | ICD-10-CM | POA: Diagnosis not present

## 2021-12-13 DIAGNOSIS — J3089 Other allergic rhinitis: Secondary | ICD-10-CM | POA: Diagnosis not present

## 2021-12-16 DIAGNOSIS — B001 Herpesviral vesicular dermatitis: Secondary | ICD-10-CM | POA: Diagnosis not present

## 2021-12-16 DIAGNOSIS — J329 Chronic sinusitis, unspecified: Secondary | ICD-10-CM | POA: Diagnosis not present

## 2021-12-16 DIAGNOSIS — H02846 Edema of left eye, unspecified eyelid: Secondary | ICD-10-CM | POA: Diagnosis not present

## 2021-12-30 DIAGNOSIS — J3089 Other allergic rhinitis: Secondary | ICD-10-CM | POA: Diagnosis not present

## 2022-01-08 DIAGNOSIS — J3089 Other allergic rhinitis: Secondary | ICD-10-CM | POA: Diagnosis not present

## 2022-01-13 DIAGNOSIS — J3089 Other allergic rhinitis: Secondary | ICD-10-CM | POA: Diagnosis not present

## 2022-01-18 DIAGNOSIS — J3089 Other allergic rhinitis: Secondary | ICD-10-CM | POA: Diagnosis not present

## 2022-02-10 DIAGNOSIS — J3081 Allergic rhinitis due to animal (cat) (dog) hair and dander: Secondary | ICD-10-CM | POA: Diagnosis not present

## 2022-02-10 DIAGNOSIS — J3089 Other allergic rhinitis: Secondary | ICD-10-CM | POA: Diagnosis not present

## 2022-02-10 DIAGNOSIS — J301 Allergic rhinitis due to pollen: Secondary | ICD-10-CM | POA: Diagnosis not present

## 2022-02-24 DIAGNOSIS — J3089 Other allergic rhinitis: Secondary | ICD-10-CM | POA: Diagnosis not present

## 2022-02-24 DIAGNOSIS — J301 Allergic rhinitis due to pollen: Secondary | ICD-10-CM | POA: Diagnosis not present

## 2022-02-24 DIAGNOSIS — J3081 Allergic rhinitis due to animal (cat) (dog) hair and dander: Secondary | ICD-10-CM | POA: Diagnosis not present

## 2022-03-03 DIAGNOSIS — J301 Allergic rhinitis due to pollen: Secondary | ICD-10-CM | POA: Diagnosis not present

## 2022-03-03 DIAGNOSIS — J3081 Allergic rhinitis due to animal (cat) (dog) hair and dander: Secondary | ICD-10-CM | POA: Diagnosis not present

## 2022-03-03 DIAGNOSIS — J3089 Other allergic rhinitis: Secondary | ICD-10-CM | POA: Diagnosis not present

## 2022-03-12 ENCOUNTER — Encounter: Payer: Self-pay | Admitting: Orthopedic Surgery

## 2022-03-12 ENCOUNTER — Ambulatory Visit (INDEPENDENT_AMBULATORY_CARE_PROVIDER_SITE_OTHER): Payer: BC Managed Care – PPO | Admitting: Orthopedic Surgery

## 2022-03-12 DIAGNOSIS — M5412 Radiculopathy, cervical region: Secondary | ICD-10-CM | POA: Diagnosis not present

## 2022-03-12 DIAGNOSIS — J3089 Other allergic rhinitis: Secondary | ICD-10-CM | POA: Diagnosis not present

## 2022-03-12 MED ORDER — PREDNISONE 5 MG (21) PO TBPK: ORAL_TABLET | ORAL | 0 refills | Status: DC

## 2022-03-12 NOTE — Progress Notes (Signed)
Office Visit Note   Patient: Barbara Glenn           Date of Birth: 06-Aug-1961           MRN: [id number] Visit Date: 03/12/2022 Requested by: No referring provider defined for this encounter. PCP: Pcp, No  Subjective: Chief Complaint  Patient presents with   Right Shoulder - Pain    HPI: Barbara Glenn is a 61 y.o. female who presents to the office complaining of right shoulder pain.  Patient states that she has lateral right shoulder pain that she describes as an achiness that is present all the time.  This feels pretty much the same as her previous shoulder pain that she had [name] by Dr. Alvester Morin in 2024.  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  She has pain in the lateral shoulder that radiates to the elbow.  Pain is worse with activity.  Denies any weakness.  Pain keeps her up at night but does not wake her up at night.  No numbness or tingling/burning pain.  She does have scapular pain but denies any neck pain..                ROS: All systems reviewed are negative as they relate to the chief complaint within the history of present illness.  Patient denies fevers or chills.  Assessment & Plan: Visit Diagnoses:  1. Radiculopathy, cervical region     Plan: Patient is a 61 year old female who presents for evaluation of right shoulder pain.  She has shoulder pain that is similar to her previous pain that she was having when she was having left-sided shoulder pain in 2020.  This was successfully treated with [name].  Has history of severe foraminal stenosis and moderate foraminal stenosis at C5-C6 and C6-C7 respectively.  Plan to try Medrol Dosepak today and refer to Dr. [name] for diagnostic and therapeutic cervical spine injection.  Follow-up after injection if no improvement.  This may require surgical intervention.   Shoulder examination unremarkable today  Follow-Up Instructions: No follow-ups on file.   Orders:  Orders Placed This Encounter  Procedures   Ambulatory referral to Physical Medicine Rehab   Meds ordered this encounter  Medications   predniSONE (STERAPRED UNI-PAK 21 TAB) 5 MG (21) TBPK tablet    Sig: Take dosepak as directed    Dispense:  21 tablet    Refill:  0      Procedures: No procedures performed   Clinical Data: No additional findings.  Objective: Vital Signs: There were no vitals taken for this visit.  Physical Exam:  Constitutional: Patient appears well-developed HEENT:  Head: Normocephalic Eyes:EOM are normal Neck: Normal range of motion Cardiovascular: Normal rate Pulmonary/chest: Effort normal Neurologic: Patient is alert Skin: Skin is warm Psychiatric: Patient has normal mood and affect  Ortho Exam: Ortho exam demonstrates right shoulder with 60 degrees external rotation, 100 degrees abduction, 175 degrees forward flexion.  Excellent strength of grip strength, finger abduction, pronation/supination, bicep, tricep, deltoid, supraspinatus, infraspinatus, subscapularis rated 5/5.  No tenderness over the bicipital groove.  No tenderness over the [facility] joint.  Mild pain with cervical spine range of motion.  Negative Spurling sign.  Negative Lhermitte sign.  Full active range of motion, to passive range of motion.  Specialty Comments:  No specialty comments available.  Imaging:  No results found.   PMFS History: There are no problems to display for this patient.  History reviewed. No pertinent past medical history.  History reviewed. No pertinent family history.  History reviewed. No pertinent surgical history. Social History   Occupational History   Not on file  Tobacco Use   Smoking status: Never   Smokeless tobacco: Never  Substance and Sexual Activity   Alcohol use: Not on file   Drug use: Not on file   Sexual activity: Not on file

## 2022-03-15 ENCOUNTER — Encounter: Payer: Self-pay | Admitting: Orthopedic Surgery

## 2022-03-18 DIAGNOSIS — G4700 Insomnia, unspecified: Secondary | ICD-10-CM | POA: Diagnosis not present

## 2022-03-18 DIAGNOSIS — F331 Major depressive disorder, recurrent, moderate: Secondary | ICD-10-CM | POA: Diagnosis not present

## 2022-03-18 DIAGNOSIS — M5412 Radiculopathy, cervical region: Secondary | ICD-10-CM | POA: Diagnosis not present

## 2022-03-18 DIAGNOSIS — F411 Generalized anxiety disorder: Secondary | ICD-10-CM | POA: Diagnosis not present

## 2022-03-26 ENCOUNTER — Telehealth: Payer: Self-pay | Admitting: Orthopedic Surgery

## 2022-03-26 ENCOUNTER — Telehealth: Payer: Self-pay | Admitting: Physical Medicine and Rehabilitation

## 2022-03-26 NOTE — Telephone Encounter (Signed)
Pt called requesting a call back. Pt states she took prednisone and it did not help with her pains. Pt is asking for a call back concerning what she can do about her pains. Please call pt at [phone].

## 2022-03-26 NOTE — Telephone Encounter (Signed)
Pt called requesting a call to see if any sooner appts. Please call pt at [phone].

## 2022-03-26 NOTE — Telephone Encounter (Signed)
IC advised should keep appt for ESI and if no relief after that should see Dean/Luke for repeat eval Please place patient on cancellation list for sooner appointment

## 2022-03-27 NOTE — Telephone Encounter (Signed)
I called discussed.  

## 2022-03-31 ENCOUNTER — Telehealth: Payer: Self-pay | Admitting: Orthopedic Surgery

## 2022-03-31 ENCOUNTER — Other Ambulatory Visit: Payer: Self-pay

## 2022-03-31 MED ORDER — MELOXICAM 15 MG PO TABS: ORAL_TABLET | ORAL | 0 refills | Status: DC

## 2022-03-31 NOTE — Telephone Encounter (Signed)
Thanks Taylor!

## 2022-03-31 NOTE — Telephone Encounter (Signed)
Pt called and was wondering if she could get some meloxicam because it seem to help last time.   [address] [place]

## 2022-03-31 NOTE — Telephone Encounter (Signed)
Okay by me.  Okay for Mobic 15mg  once daily prn #60

## 2022-04-01 DIAGNOSIS — Z13220 Encounter for screening for lipoid disorders: Secondary | ICD-10-CM | POA: Diagnosis not present

## 2022-04-01 DIAGNOSIS — Z0000 Encounter for general adult medical examination without abnormal findings: Secondary | ICD-10-CM | POA: Diagnosis not present

## 2022-04-01 DIAGNOSIS — J301 Allergic rhinitis due to pollen: Secondary | ICD-10-CM | POA: Diagnosis not present

## 2022-04-01 DIAGNOSIS — Z114 Encounter for screening for human immunodeficiency virus [HIV]: Secondary | ICD-10-CM | POA: Diagnosis not present

## 2022-04-01 DIAGNOSIS — J3089 Other allergic rhinitis: Secondary | ICD-10-CM | POA: Diagnosis not present

## 2022-04-01 DIAGNOSIS — J3081 Allergic rhinitis due to animal (cat) (dog) hair and dander: Secondary | ICD-10-CM | POA: Diagnosis not present

## 2022-04-10 ENCOUNTER — Encounter: Payer: Self-pay | Admitting: Physical Medicine and Rehabilitation

## 2022-04-10 ENCOUNTER — Ambulatory Visit: Payer: Self-pay

## 2022-04-10 ENCOUNTER — Ambulatory Visit (INDEPENDENT_AMBULATORY_CARE_PROVIDER_SITE_OTHER): Payer: BC Managed Care – PPO | Admitting: Physical Medicine and Rehabilitation

## 2022-04-10 VITALS — BP 152/71 | HR 72

## 2022-04-10 DIAGNOSIS — J301 Allergic rhinitis due to pollen: Secondary | ICD-10-CM | POA: Diagnosis not present

## 2022-04-10 DIAGNOSIS — H6121 Impacted cerumen, right ear: Secondary | ICD-10-CM | POA: Diagnosis not present

## 2022-04-10 DIAGNOSIS — Z23 Encounter for immunization: Secondary | ICD-10-CM | POA: Diagnosis not present

## 2022-04-10 DIAGNOSIS — J3089 Other allergic rhinitis: Secondary | ICD-10-CM | POA: Diagnosis not present

## 2022-04-10 DIAGNOSIS — Z1211 Encounter for screening for malignant neoplasm of colon: Secondary | ICD-10-CM | POA: Diagnosis not present

## 2022-04-10 DIAGNOSIS — M5412 Radiculopathy, cervical region: Secondary | ICD-10-CM | POA: Diagnosis not present

## 2022-04-10 DIAGNOSIS — J3081 Allergic rhinitis due to animal (cat) (dog) hair and dander: Secondary | ICD-10-CM | POA: Diagnosis not present

## 2022-04-10 DIAGNOSIS — Z0000 Encounter for general adult medical examination without abnormal findings: Secondary | ICD-10-CM | POA: Diagnosis not present

## 2022-04-10 MED ORDER — METHYLPREDNISOLONE ACETATE 80 MG/ML IJ SUSP
80.0000 mg | Freq: Once | INTRAMUSCULAR | Status: AC
  Administered 2022-04-10: 80 mg

## 2022-04-10 NOTE — Progress Notes (Unsigned)
Pt state lower back pain. Pt state standing makes the pain worse. Pt state she takes over the counter pain meds to help ease her pain.  Numeric Pain Rating Scale and Functional Assessment Average Pain 9   In the last MONTH (on 0-10 scale) has pain interfered with the following?  1. General activity like being  able to carry out your everyday physical activities such as walking, climbing stairs, carrying groceries, or moving a chair?  Rating(10)   +Driver, -BT, -Dye Allergies.

## 2022-04-10 NOTE — Patient Instructions (Signed)

## 2022-04-13 DIAGNOSIS — J029 Acute pharyngitis, unspecified: Secondary | ICD-10-CM | POA: Diagnosis not present

## 2022-04-15 DIAGNOSIS — B9689 Other specified bacterial agents as the cause of diseases classified elsewhere: Secondary | ICD-10-CM | POA: Diagnosis not present

## 2022-04-15 DIAGNOSIS — J329 Chronic sinusitis, unspecified: Secondary | ICD-10-CM | POA: Diagnosis not present

## 2022-04-15 DIAGNOSIS — Z20818 Contact with and (suspected) exposure to other bacterial communicable diseases: Secondary | ICD-10-CM | POA: Diagnosis not present

## 2022-04-15 DIAGNOSIS — J069 Acute upper respiratory infection, unspecified: Secondary | ICD-10-CM | POA: Diagnosis not present

## 2022-04-24 DIAGNOSIS — H6503 Acute serous otitis media, bilateral: Secondary | ICD-10-CM | POA: Diagnosis not present

## 2022-05-14 DIAGNOSIS — J309 Allergic rhinitis, unspecified: Secondary | ICD-10-CM | POA: Diagnosis not present

## 2022-05-14 DIAGNOSIS — J069 Acute upper respiratory infection, unspecified: Secondary | ICD-10-CM | POA: Diagnosis not present

## 2022-05-14 DIAGNOSIS — J329 Chronic sinusitis, unspecified: Secondary | ICD-10-CM | POA: Diagnosis not present

## 2022-05-16 DIAGNOSIS — J3089 Other allergic rhinitis: Secondary | ICD-10-CM | POA: Diagnosis not present

## 2022-05-16 DIAGNOSIS — J3081 Allergic rhinitis due to animal (cat) (dog) hair and dander: Secondary | ICD-10-CM | POA: Diagnosis not present

## 2022-05-16 DIAGNOSIS — J301 Allergic rhinitis due to pollen: Secondary | ICD-10-CM | POA: Diagnosis not present

## 2022-05-16 DIAGNOSIS — M50122 Cervical disc disorder at C5-C6 level with radiculopathy: Secondary | ICD-10-CM | POA: Diagnosis not present

## 2022-05-16 DIAGNOSIS — M542 Cervicalgia: Secondary | ICD-10-CM | POA: Diagnosis not present

## 2022-05-16 DIAGNOSIS — M25512 Pain in left shoulder: Secondary | ICD-10-CM | POA: Diagnosis not present

## 2022-05-16 DIAGNOSIS — M5412 Radiculopathy, cervical region: Secondary | ICD-10-CM | POA: Diagnosis not present

## 2022-05-19 DIAGNOSIS — M542 Cervicalgia: Secondary | ICD-10-CM | POA: Diagnosis not present

## 2022-05-19 DIAGNOSIS — M25512 Pain in left shoulder: Secondary | ICD-10-CM | POA: Diagnosis not present

## 2022-05-19 DIAGNOSIS — M5412 Radiculopathy, cervical region: Secondary | ICD-10-CM | POA: Diagnosis not present

## 2022-05-19 DIAGNOSIS — M50122 Cervical disc disorder at C5-C6 level with radiculopathy: Secondary | ICD-10-CM | POA: Diagnosis not present

## 2022-05-23 DIAGNOSIS — M25512 Pain in left shoulder: Secondary | ICD-10-CM | POA: Diagnosis not present

## 2022-05-23 DIAGNOSIS — J3081 Allergic rhinitis due to animal (cat) (dog) hair and dander: Secondary | ICD-10-CM | POA: Diagnosis not present

## 2022-05-23 DIAGNOSIS — M542 Cervicalgia: Secondary | ICD-10-CM | POA: Diagnosis not present

## 2022-05-23 DIAGNOSIS — M5412 Radiculopathy, cervical region: Secondary | ICD-10-CM | POA: Diagnosis not present

## 2022-05-23 DIAGNOSIS — M50122 Cervical disc disorder at C5-C6 level with radiculopathy: Secondary | ICD-10-CM | POA: Diagnosis not present

## 2022-05-23 DIAGNOSIS — J3089 Other allergic rhinitis: Secondary | ICD-10-CM | POA: Diagnosis not present

## 2022-05-23 DIAGNOSIS — J301 Allergic rhinitis due to pollen: Secondary | ICD-10-CM | POA: Diagnosis not present

## 2022-05-27 ENCOUNTER — Other Ambulatory Visit: Payer: Self-pay | Admitting: Surgical

## 2022-06-03 DIAGNOSIS — J301 Allergic rhinitis due to pollen: Secondary | ICD-10-CM | POA: Diagnosis not present

## 2022-06-03 DIAGNOSIS — J3089 Other allergic rhinitis: Secondary | ICD-10-CM | POA: Diagnosis not present

## 2022-06-03 DIAGNOSIS — J3081 Allergic rhinitis due to animal (cat) (dog) hair and dander: Secondary | ICD-10-CM | POA: Diagnosis not present

## 2022-06-09 DIAGNOSIS — J3089 Other allergic rhinitis: Secondary | ICD-10-CM | POA: Diagnosis not present

## 2022-06-09 DIAGNOSIS — J3081 Allergic rhinitis due to animal (cat) (dog) hair and dander: Secondary | ICD-10-CM | POA: Diagnosis not present

## 2022-06-09 DIAGNOSIS — J301 Allergic rhinitis due to pollen: Secondary | ICD-10-CM | POA: Diagnosis not present

## 2022-06-16 DIAGNOSIS — J3089 Other allergic rhinitis: Secondary | ICD-10-CM | POA: Diagnosis not present

## 2022-06-23 DIAGNOSIS — J3081 Allergic rhinitis due to animal (cat) (dog) hair and dander: Secondary | ICD-10-CM | POA: Diagnosis not present

## 2022-06-23 DIAGNOSIS — J3089 Other allergic rhinitis: Secondary | ICD-10-CM | POA: Diagnosis not present

## 2022-06-23 DIAGNOSIS — J301 Allergic rhinitis due to pollen: Secondary | ICD-10-CM | POA: Diagnosis not present

## 2022-07-02 DIAGNOSIS — J301 Allergic rhinitis due to pollen: Secondary | ICD-10-CM | POA: Diagnosis not present

## 2022-07-02 DIAGNOSIS — J3081 Allergic rhinitis due to animal (cat) (dog) hair and dander: Secondary | ICD-10-CM | POA: Diagnosis not present

## 2022-07-02 DIAGNOSIS — J3089 Other allergic rhinitis: Secondary | ICD-10-CM | POA: Diagnosis not present

## 2022-07-03 DIAGNOSIS — I10 Essential (primary) hypertension: Secondary | ICD-10-CM | POA: Diagnosis not present

## 2022-07-03 DIAGNOSIS — E782 Mixed hyperlipidemia: Secondary | ICD-10-CM | POA: Diagnosis not present

## 2022-07-03 DIAGNOSIS — R739 Hyperglycemia, unspecified: Secondary | ICD-10-CM | POA: Diagnosis not present

## 2022-07-10 DIAGNOSIS — Z789 Other specified health status: Secondary | ICD-10-CM | POA: Diagnosis not present

## 2022-07-10 DIAGNOSIS — I10 Essential (primary) hypertension: Secondary | ICD-10-CM | POA: Diagnosis not present

## 2022-07-10 DIAGNOSIS — G720 Drug-induced myopathy: Secondary | ICD-10-CM | POA: Diagnosis not present

## 2022-07-10 DIAGNOSIS — J3089 Other allergic rhinitis: Secondary | ICD-10-CM | POA: Diagnosis not present

## 2022-07-10 DIAGNOSIS — Z23 Encounter for immunization: Secondary | ICD-10-CM | POA: Diagnosis not present

## 2022-07-10 DIAGNOSIS — E782 Mixed hyperlipidemia: Secondary | ICD-10-CM | POA: Diagnosis not present

## 2022-07-14 ENCOUNTER — Other Ambulatory Visit: Payer: Self-pay | Admitting: Nurse Practitioner

## 2022-07-14 DIAGNOSIS — Z8249 Family history of ischemic heart disease and other diseases of the circulatory system: Secondary | ICD-10-CM | POA: Diagnosis not present

## 2022-07-14 DIAGNOSIS — I10 Essential (primary) hypertension: Secondary | ICD-10-CM

## 2022-07-14 DIAGNOSIS — Z8489 Family history of other specified conditions: Secondary | ICD-10-CM | POA: Diagnosis not present

## 2022-07-14 DIAGNOSIS — E669 Obesity, unspecified: Secondary | ICD-10-CM | POA: Diagnosis not present

## 2022-07-18 DIAGNOSIS — J3089 Other allergic rhinitis: Secondary | ICD-10-CM | POA: Diagnosis not present

## 2022-07-23 DIAGNOSIS — H6983 Other specified disorders of Eustachian tube, bilateral: Secondary | ICD-10-CM | POA: Diagnosis not present

## 2022-07-23 DIAGNOSIS — H6123 Impacted cerumen, bilateral: Secondary | ICD-10-CM | POA: Diagnosis not present

## 2022-07-23 DIAGNOSIS — Z8669 Personal history of other diseases of the nervous system and sense organs: Secondary | ICD-10-CM | POA: Diagnosis not present

## 2022-07-23 DIAGNOSIS — H93299 Other abnormal auditory perceptions, unspecified ear: Secondary | ICD-10-CM | POA: Diagnosis not present

## 2022-07-25 DIAGNOSIS — J3089 Other allergic rhinitis: Secondary | ICD-10-CM | POA: Diagnosis not present

## 2022-07-29 ENCOUNTER — Other Ambulatory Visit: Payer: Self-pay | Admitting: Surgical

## 2022-08-04 DIAGNOSIS — J301 Allergic rhinitis due to pollen: Secondary | ICD-10-CM | POA: Diagnosis not present

## 2022-08-04 DIAGNOSIS — J3089 Other allergic rhinitis: Secondary | ICD-10-CM | POA: Diagnosis not present

## 2022-08-04 DIAGNOSIS — J3081 Allergic rhinitis due to animal (cat) (dog) hair and dander: Secondary | ICD-10-CM | POA: Diagnosis not present

## 2022-08-08 DIAGNOSIS — J3081 Allergic rhinitis due to animal (cat) (dog) hair and dander: Secondary | ICD-10-CM | POA: Diagnosis not present

## 2022-08-08 DIAGNOSIS — J301 Allergic rhinitis due to pollen: Secondary | ICD-10-CM | POA: Diagnosis not present

## 2022-08-08 DIAGNOSIS — J3089 Other allergic rhinitis: Secondary | ICD-10-CM | POA: Diagnosis not present

## 2022-08-11 ENCOUNTER — Ambulatory Visit (INDEPENDENT_AMBULATORY_CARE_PROVIDER_SITE_OTHER): Payer: BC Managed Care – PPO | Admitting: Orthopedic Surgery

## 2022-08-11 ENCOUNTER — Ambulatory Visit (INDEPENDENT_AMBULATORY_CARE_PROVIDER_SITE_OTHER): Payer: BC Managed Care – PPO

## 2022-08-11 ENCOUNTER — Encounter: Payer: Self-pay | Admitting: Orthopedic Surgery

## 2022-08-11 DIAGNOSIS — M7661 Achilles tendinitis, right leg: Secondary | ICD-10-CM | POA: Diagnosis not present

## 2022-08-11 DIAGNOSIS — M7660 Achilles tendinitis, unspecified leg: Secondary | ICD-10-CM

## 2022-08-11 NOTE — Progress Notes (Signed)
Office Visit Note   Patient: Barbara Glenn           Date of Birth: [birth year]           MRN: [id number] Visit Date: 08/11/2022 Requested by: No referring provider defined for this encounter. PCP: Pcp, No  Subjective: Chief Complaint  Patient presents with   Right Ankle - Pain    HPI: Barbara Glenn is a 61 y.o. female who presents to the office reporting right ankle and heel pain for 4 weeks.  Denies any history of injury.  Did roll her ankle several months ago.  Does not wake her from sleep.  Has start up pain as well as pain with heel strike.  Primarily in the lateral aspect of the tendon.  Does a little better after warm up walking.  Has not taken any medication or tried anything topical.  Symptoms are not improving or lessening.  She does not do sports.  Likes to walk 3 times a week for 1 to 2 miles..                ROS: All systems reviewed are negative as they relate to the chief complaint within the history of present illness.  Patient denies fevers or chills.  Assessment & Plan: Visit Diagnoses:  1. 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  This is consistent with tendinosis in her diagnosis.  Not particularly tender at the tendon insertion site on the calcaneus.  Plan is eccentric stretching and strengthening with topical Voltaren.  Would also recommend that she get some new shoes that have very solid arch support.  Follow-up as needed  Follow-Up Instructions: No follow-ups on file.   Orders:  Orders Placed This Encounter  Procedures   XR Ankle Complete Right   No orders of the defined types were placed in this encounter.     Procedures: No procedures performed   Clinical Data: No additional findings.  Objective: Vital Signs: There were no vitals taken for this visit.  Physical Exam:  Constitutional: Patient appears well-developed HEENT:  Head:  Normocephalic Eyes:EOM are normal Neck: Normal range of motion Cardiovascular: Normal rate Pulmonary/chest: Effort normal Neurologic: Patient is alert Skin: Skin is warm Psychiatric: Patient has normal mood and affect  Ortho Exam: Ortho exam demonstrates full active and passive range of motion of both ankles with nonantalgic gait.  Small nodule noted in the watershed area on the lateral side of the Achilles tendon.  Ultrasound examination demonstrates this finding to be consistent with tendinosis.  She does have palpable intact functional Achilles anterior to posterior tib and peroneal tendons.  Palpable pedal pulses with symmetric tibiotalar subtalar transverse tarsal range of motion.  Heel cord not excessively tight.  Specialty Comments:  No specialty comments available.  Imaging: XR Ankle Complete Right  Result Date: 08/11/2022 AP lateral mortise radiographs right ankle reviewed.  No acute fracture.  Boehler's angle normal.  Spurring is present both in the plantar fascia region as well as the Achilles attachment area.    PMFS History: There are no problems to display for this patient.  History reviewed. No pertinent past medical history.  History reviewed. No pertinent family history.  History reviewed. No pertinent surgical history. Social History   Occupational History   Not on file  Tobacco Use   Smoking status: Never   Smokeless tobacco: Never  Substance  and Sexual Activity   Alcohol use: Not on file   Drug use: Not on file   Sexual activity: Not on file

## 2022-08-21 DIAGNOSIS — J3089 Other allergic rhinitis: Secondary | ICD-10-CM | POA: Diagnosis not present

## 2022-08-21 DIAGNOSIS — J301 Allergic rhinitis due to pollen: Secondary | ICD-10-CM | POA: Diagnosis not present

## 2022-08-21 DIAGNOSIS — J3081 Allergic rhinitis due to animal (cat) (dog) hair and dander: Secondary | ICD-10-CM | POA: Diagnosis not present

## 2022-08-29 DIAGNOSIS — J3081 Allergic rhinitis due to animal (cat) (dog) hair and dander: Secondary | ICD-10-CM | POA: Diagnosis not present

## 2022-08-29 DIAGNOSIS — J3089 Other allergic rhinitis: Secondary | ICD-10-CM | POA: Diagnosis not present

## 2022-08-29 DIAGNOSIS — J301 Allergic rhinitis due to pollen: Secondary | ICD-10-CM | POA: Diagnosis not present

## 2022-09-12 DIAGNOSIS — J301 Allergic rhinitis due to pollen: Secondary | ICD-10-CM | POA: Diagnosis not present

## 2022-09-12 DIAGNOSIS — E782 Mixed hyperlipidemia: Secondary | ICD-10-CM | POA: Diagnosis not present

## 2022-09-12 DIAGNOSIS — J3089 Other allergic rhinitis: Secondary | ICD-10-CM | POA: Diagnosis not present

## 2022-09-12 DIAGNOSIS — J3081 Allergic rhinitis due to animal (cat) (dog) hair and dander: Secondary | ICD-10-CM | POA: Diagnosis not present

## 2022-09-15 DIAGNOSIS — Z789 Other specified health status: Secondary | ICD-10-CM | POA: Diagnosis not present

## 2022-09-15 DIAGNOSIS — G4700 Insomnia, unspecified: Secondary | ICD-10-CM | POA: Diagnosis not present

## 2022-09-15 DIAGNOSIS — E782 Mixed hyperlipidemia: Secondary | ICD-10-CM | POA: Diagnosis not present

## 2022-09-15 DIAGNOSIS — D509 Iron deficiency anemia, unspecified: Secondary | ICD-10-CM | POA: Diagnosis not present

## 2022-09-15 DIAGNOSIS — I10 Essential (primary) hypertension: Secondary | ICD-10-CM | POA: Diagnosis not present

## 2022-09-16 DIAGNOSIS — H903 Sensorineural hearing loss, bilateral: Secondary | ICD-10-CM | POA: Diagnosis not present

## 2022-09-17 DIAGNOSIS — J3081 Allergic rhinitis due to animal (cat) (dog) hair and dander: Secondary | ICD-10-CM | POA: Diagnosis not present

## 2022-09-17 DIAGNOSIS — J301 Allergic rhinitis due to pollen: Secondary | ICD-10-CM | POA: Diagnosis not present

## 2022-09-17 DIAGNOSIS — J3089 Other allergic rhinitis: Secondary | ICD-10-CM | POA: Diagnosis not present

## 2022-09-22 DIAGNOSIS — J3081 Allergic rhinitis due to animal (cat) (dog) hair and dander: Secondary | ICD-10-CM | POA: Diagnosis not present

## 2022-09-22 DIAGNOSIS — J3089 Other allergic rhinitis: Secondary | ICD-10-CM | POA: Diagnosis not present

## 2022-09-22 DIAGNOSIS — J301 Allergic rhinitis due to pollen: Secondary | ICD-10-CM | POA: Diagnosis not present

## 2022-09-22 DIAGNOSIS — R21 Rash and other nonspecific skin eruption: Secondary | ICD-10-CM | POA: Diagnosis not present

## 2022-09-24 DIAGNOSIS — D509 Iron deficiency anemia, unspecified: Secondary | ICD-10-CM | POA: Diagnosis not present

## 2022-09-24 DIAGNOSIS — Z6825 Body mass index (BMI) 25.0-25.9, adult: Secondary | ICD-10-CM | POA: Diagnosis not present

## 2022-09-24 DIAGNOSIS — R7989 Other specified abnormal findings of blood chemistry: Secondary | ICD-10-CM | POA: Diagnosis not present

## 2022-09-27 ENCOUNTER — Other Ambulatory Visit: Payer: Self-pay | Admitting: Surgical

## 2022-10-06 DIAGNOSIS — D2272 Melanocytic nevi of left lower limb, including hip: Secondary | ICD-10-CM | POA: Diagnosis not present

## 2022-10-06 DIAGNOSIS — D225 Melanocytic nevi of trunk: Secondary | ICD-10-CM | POA: Diagnosis not present

## 2022-10-06 DIAGNOSIS — D2271 Melanocytic nevi of right lower limb, including hip: Secondary | ICD-10-CM | POA: Diagnosis not present

## 2022-10-06 DIAGNOSIS — D2261 Melanocytic nevi of right upper limb, including shoulder: Secondary | ICD-10-CM | POA: Diagnosis not present

## 2022-10-14 DIAGNOSIS — H9193 Unspecified hearing loss, bilateral: Secondary | ICD-10-CM | POA: Diagnosis not present

## 2022-10-24 DIAGNOSIS — J3089 Other allergic rhinitis: Secondary | ICD-10-CM | POA: Diagnosis not present

## 2022-10-24 DIAGNOSIS — J3081 Allergic rhinitis due to animal (cat) (dog) hair and dander: Secondary | ICD-10-CM | POA: Diagnosis not present

## 2022-10-24 DIAGNOSIS — J301 Allergic rhinitis due to pollen: Secondary | ICD-10-CM | POA: Diagnosis not present

## 2022-11-05 DIAGNOSIS — J301 Allergic rhinitis due to pollen: Secondary | ICD-10-CM | POA: Diagnosis not present

## 2022-11-05 DIAGNOSIS — J3081 Allergic rhinitis due to animal (cat) (dog) hair and dander: Secondary | ICD-10-CM | POA: Diagnosis not present

## 2022-11-05 DIAGNOSIS — J3089 Other allergic rhinitis: Secondary | ICD-10-CM | POA: Diagnosis not present

## 2022-11-25 ENCOUNTER — Other Ambulatory Visit: Payer: Self-pay | Admitting: Surgical

## 2022-11-26 DIAGNOSIS — J3081 Allergic rhinitis due to animal (cat) (dog) hair and dander: Secondary | ICD-10-CM | POA: Diagnosis not present

## 2022-11-26 DIAGNOSIS — J3089 Other allergic rhinitis: Secondary | ICD-10-CM | POA: Diagnosis not present

## 2022-11-26 DIAGNOSIS — J301 Allergic rhinitis due to pollen: Secondary | ICD-10-CM | POA: Diagnosis not present

## 2022-12-08 DIAGNOSIS — J3081 Allergic rhinitis due to animal (cat) (dog) hair and dander: Secondary | ICD-10-CM | POA: Diagnosis not present

## 2022-12-08 DIAGNOSIS — J301 Allergic rhinitis due to pollen: Secondary | ICD-10-CM | POA: Diagnosis not present

## 2022-12-08 DIAGNOSIS — J3089 Other allergic rhinitis: Secondary | ICD-10-CM | POA: Diagnosis not present

## 2022-12-15 DIAGNOSIS — D509 Iron deficiency anemia, unspecified: Secondary | ICD-10-CM | POA: Diagnosis not present

## 2022-12-15 DIAGNOSIS — R7989 Other specified abnormal findings of blood chemistry: Secondary | ICD-10-CM | POA: Diagnosis not present

## 2022-12-15 DIAGNOSIS — E559 Vitamin D deficiency, unspecified: Secondary | ICD-10-CM | POA: Diagnosis not present

## 2022-12-22 DIAGNOSIS — Z8249 Family history of ischemic heart disease and other diseases of the circulatory system: Secondary | ICD-10-CM | POA: Diagnosis not present

## 2022-12-22 DIAGNOSIS — E559 Vitamin D deficiency, unspecified: Secondary | ICD-10-CM | POA: Diagnosis not present

## 2022-12-22 DIAGNOSIS — R7989 Other specified abnormal findings of blood chemistry: Secondary | ICD-10-CM | POA: Diagnosis not present

## 2022-12-22 DIAGNOSIS — Z9882 Breast implant status: Secondary | ICD-10-CM | POA: Diagnosis not present

## 2022-12-24 DIAGNOSIS — J301 Allergic rhinitis due to pollen: Secondary | ICD-10-CM | POA: Diagnosis not present

## 2022-12-24 DIAGNOSIS — J3081 Allergic rhinitis due to animal (cat) (dog) hair and dander: Secondary | ICD-10-CM | POA: Diagnosis not present

## 2022-12-24 DIAGNOSIS — J3089 Other allergic rhinitis: Secondary | ICD-10-CM | POA: Diagnosis not present

## 2022-12-29 DIAGNOSIS — J301 Allergic rhinitis due to pollen: Secondary | ICD-10-CM | POA: Diagnosis not present

## 2022-12-29 DIAGNOSIS — J3081 Allergic rhinitis due to animal (cat) (dog) hair and dander: Secondary | ICD-10-CM | POA: Diagnosis not present

## 2022-12-29 DIAGNOSIS — J3089 Other allergic rhinitis: Secondary | ICD-10-CM | POA: Diagnosis not present

## 2023-01-09 DIAGNOSIS — R92323 Mammographic fibroglandular density, bilateral breasts: Secondary | ICD-10-CM | POA: Diagnosis not present

## 2023-01-09 DIAGNOSIS — Z1231 Encounter for screening mammogram for malignant neoplasm of breast: Secondary | ICD-10-CM | POA: Diagnosis not present

## 2023-01-12 DIAGNOSIS — J301 Allergic rhinitis due to pollen: Secondary | ICD-10-CM | POA: Diagnosis not present

## 2023-01-12 DIAGNOSIS — J3089 Other allergic rhinitis: Secondary | ICD-10-CM | POA: Diagnosis not present

## 2023-01-12 DIAGNOSIS — R21 Rash and other nonspecific skin eruption: Secondary | ICD-10-CM | POA: Diagnosis not present

## 2023-01-12 DIAGNOSIS — J3081 Allergic rhinitis due to animal (cat) (dog) hair and dander: Secondary | ICD-10-CM | POA: Diagnosis not present

## 2023-01-22 ENCOUNTER — Other Ambulatory Visit: Payer: Self-pay | Admitting: Surgical

## 2023-01-29 ENCOUNTER — Ambulatory Visit (INDEPENDENT_AMBULATORY_CARE_PROVIDER_SITE_OTHER): Payer: BC Managed Care – PPO | Admitting: Orthopedic Surgery

## 2023-01-29 DIAGNOSIS — M7660 Achilles tendinitis, unspecified leg: Secondary | ICD-10-CM | POA: Diagnosis not present

## 2023-01-31 ENCOUNTER — Encounter: Payer: Self-pay | Admitting: Orthopedic Surgery

## 2023-01-31 NOTE — Progress Notes (Signed)
   Office Visit Note   Patient: Barbara Glenn           Date of Birth: [birth year]           MRN: [id number] Visit Date: 01/29/2023 Requested by: No referring provider defined for this encounter. PCP: Pcp, No  Subjective: Chief Complaint  Patient presents with   Right Ankle - Follow-up    HPI: Barbara Glenn is a 62 y.o. female who presents to the office reporting right ankle pain.  She has good and bad days.  Overall she has been on the trend of improvement since about a month ago.  She has been doing some stretching and using Voltaren cream.  She started exercising again including bike treadmill and elliptical.  Her neck is stable..                ROS: All systems reviewed are negative as they relate to the chief complaint within the history of present illness.  Patient denies fevers or chills.  Assessment & Plan: Visit Diagnoses:  1. Pain in Achilles tendon     Plan: Impression is improvement in right Achilles tendinitis.  That little nodule she had in the watershed zone is nontender and feels smaller than 1 cm size 6 months ago.  Overall clinically she has made good progress with flexibility as well.  She will follow-up as needed.  Could consider shockwave therapy with Dr. Shon Baton if her symptoms recur.  Follow-Up Instructions: No follow-ups on file.   Orders:  No orders of the defined types were placed in this encounter.  No orders of the defined types were placed in this encounter.     Procedures: No procedures performed   Clinical Data: No additional findings.  Objective: Vital Signs: There were no vitals taken for this visit.  Physical Exam:  Constitutional: Patient appears well-developed HEENT:  Head: Normocephalic Eyes:EOM are normal Neck: Normal range of motion Cardiovascular: Normal rate Pulmonary/chest: Effort normal Neurologic: Patient is alert Skin: Skin is warm Psychiatric: Patient has normal mood and affect  Ortho Exam: Ortho exam demonstrates  about 25 degrees of ankle dorsiflexion laterally.  Palpable intact nontender anterior to posterior to peroneal and Achilles tendons.  No real synovitis or tenosynovitis around the Achilles tendon on the right-hand side.  The nodule in the watershed zone is about 5 mm which is smaller than it was 6 months ago.  Pedal pulses palpable.  Sensation intact on the dorsal and plantar aspect of the foot.  Specialty Comments:  No specialty comments available.  Imaging: No results found.   PMFS History: There are no problems to display for this patient.  No past medical history on file.  No family history on file.  No past surgical history on file. Social History   Occupational History   Not on file  Tobacco Use   Smoking status: Never   Smokeless tobacco: Never  Substance and Sexual Activity   Alcohol use: Not on file   Drug use: Not on file   Sexual activity: Not on file

## 2023-02-05 DIAGNOSIS — J3081 Allergic rhinitis due to animal (cat) (dog) hair and dander: Secondary | ICD-10-CM | POA: Diagnosis not present

## 2023-02-05 DIAGNOSIS — J3089 Other allergic rhinitis: Secondary | ICD-10-CM | POA: Diagnosis not present

## 2023-02-05 DIAGNOSIS — J301 Allergic rhinitis due to pollen: Secondary | ICD-10-CM | POA: Diagnosis not present

## 2023-02-13 DIAGNOSIS — J3089 Other allergic rhinitis: Secondary | ICD-10-CM | POA: Diagnosis not present

## 2023-02-13 DIAGNOSIS — J3081 Allergic rhinitis due to animal (cat) (dog) hair and dander: Secondary | ICD-10-CM | POA: Diagnosis not present

## 2023-02-13 DIAGNOSIS — J301 Allergic rhinitis due to pollen: Secondary | ICD-10-CM | POA: Diagnosis not present

## 2023-02-23 DIAGNOSIS — J3089 Other allergic rhinitis: Secondary | ICD-10-CM | POA: Diagnosis not present

## 2023-02-24 DIAGNOSIS — J3089 Other allergic rhinitis: Secondary | ICD-10-CM | POA: Diagnosis not present

## 2023-03-02 DIAGNOSIS — J3089 Other allergic rhinitis: Secondary | ICD-10-CM | POA: Diagnosis not present

## 2023-03-02 DIAGNOSIS — J301 Allergic rhinitis due to pollen: Secondary | ICD-10-CM | POA: Diagnosis not present

## 2023-03-02 DIAGNOSIS — J3081 Allergic rhinitis due to animal (cat) (dog) hair and dander: Secondary | ICD-10-CM | POA: Diagnosis not present

## 2023-03-09 DIAGNOSIS — Z114 Encounter for screening for human immunodeficiency virus [HIV]: Secondary | ICD-10-CM | POA: Diagnosis not present

## 2023-03-09 DIAGNOSIS — R7989 Other specified abnormal findings of blood chemistry: Secondary | ICD-10-CM | POA: Diagnosis not present

## 2023-03-09 DIAGNOSIS — Z13220 Encounter for screening for lipoid disorders: Secondary | ICD-10-CM | POA: Diagnosis not present

## 2023-03-09 DIAGNOSIS — F331 Major depressive disorder, recurrent, moderate: Secondary | ICD-10-CM | POA: Diagnosis not present

## 2023-03-09 DIAGNOSIS — F411 Generalized anxiety disorder: Secondary | ICD-10-CM | POA: Diagnosis not present

## 2023-03-09 DIAGNOSIS — R0683 Snoring: Secondary | ICD-10-CM | POA: Diagnosis not present

## 2023-03-09 DIAGNOSIS — D509 Iron deficiency anemia, unspecified: Secondary | ICD-10-CM | POA: Diagnosis not present

## 2023-03-09 DIAGNOSIS — G4700 Insomnia, unspecified: Secondary | ICD-10-CM | POA: Diagnosis not present

## 2023-03-12 DIAGNOSIS — J3089 Other allergic rhinitis: Secondary | ICD-10-CM | POA: Diagnosis not present

## 2023-03-18 DIAGNOSIS — G720 Drug-induced myopathy: Secondary | ICD-10-CM | POA: Diagnosis not present

## 2023-03-18 DIAGNOSIS — E782 Mixed hyperlipidemia: Secondary | ICD-10-CM | POA: Diagnosis not present

## 2023-03-18 DIAGNOSIS — Z789 Other specified health status: Secondary | ICD-10-CM | POA: Diagnosis not present

## 2023-03-20 DIAGNOSIS — J301 Allergic rhinitis due to pollen: Secondary | ICD-10-CM | POA: Diagnosis not present

## 2023-03-20 DIAGNOSIS — J3089 Other allergic rhinitis: Secondary | ICD-10-CM | POA: Diagnosis not present

## 2023-03-20 DIAGNOSIS — J3081 Allergic rhinitis due to animal (cat) (dog) hair and dander: Secondary | ICD-10-CM | POA: Diagnosis not present

## 2023-03-27 DIAGNOSIS — J3089 Other allergic rhinitis: Secondary | ICD-10-CM | POA: Diagnosis not present

## 2023-04-03 DIAGNOSIS — J301 Allergic rhinitis due to pollen: Secondary | ICD-10-CM | POA: Diagnosis not present

## 2023-04-03 DIAGNOSIS — J3081 Allergic rhinitis due to animal (cat) (dog) hair and dander: Secondary | ICD-10-CM | POA: Diagnosis not present

## 2023-04-03 DIAGNOSIS — J3089 Other allergic rhinitis: Secondary | ICD-10-CM | POA: Diagnosis not present

## 2023-04-14 DIAGNOSIS — Z114 Encounter for screening for human immunodeficiency virus [HIV]: Secondary | ICD-10-CM | POA: Diagnosis not present

## 2023-04-14 DIAGNOSIS — Z13220 Encounter for screening for lipoid disorders: Secondary | ICD-10-CM | POA: Diagnosis not present

## 2023-04-14 DIAGNOSIS — Z0000 Encounter for general adult medical examination without abnormal findings: Secondary | ICD-10-CM | POA: Diagnosis not present

## 2023-04-15 DIAGNOSIS — J3081 Allergic rhinitis due to animal (cat) (dog) hair and dander: Secondary | ICD-10-CM | POA: Diagnosis not present

## 2023-04-15 DIAGNOSIS — J301 Allergic rhinitis due to pollen: Secondary | ICD-10-CM | POA: Diagnosis not present

## 2023-04-15 DIAGNOSIS — J3089 Other allergic rhinitis: Secondary | ICD-10-CM | POA: Diagnosis not present

## 2023-04-23 DIAGNOSIS — J3089 Other allergic rhinitis: Secondary | ICD-10-CM | POA: Diagnosis not present

## 2023-04-23 DIAGNOSIS — J3081 Allergic rhinitis due to animal (cat) (dog) hair and dander: Secondary | ICD-10-CM | POA: Diagnosis not present

## 2023-04-23 DIAGNOSIS — Z0000 Encounter for general adult medical examination without abnormal findings: Secondary | ICD-10-CM | POA: Diagnosis not present

## 2023-04-23 DIAGNOSIS — J301 Allergic rhinitis due to pollen: Secondary | ICD-10-CM | POA: Diagnosis not present

## 2023-04-27 NOTE — Progress Notes (Signed)
[facility] [phone]   Fax:([phone]  INITIAL CONSULT NOTE  Patient Care Team: Pcp, No as PCP - General  Hematological/Oncological History  Labs from PCP, Dr. Maryelizabeth Rowan:  12/15/2022: Iron 147, Ferritin 444.2 (H) 03/09/2023:Ferritin 598.57 (H), iron 152, saturation 60% (H), TIBC 254 (L).  04/28/2023: Establish care with [facility] Hematology  CHIEF COMPLAINTS/PURPOSE OF CONSULTATION:  Elevated Ferritin  HISTORY OF PRESENTING ILLNESS:  Barbara Glenn 62 y.o. female with medical history significant for hyperlipidemia and hypertension presents to the hematology clinic for evaluation for elevated ferritin levels. She is unaccompanied for this visit.   On exam today, Barbara Glenn reports that she is doing well without any new or concerning symptoms. She reports her energy levels and appetite are stable. She denies any recent iron supplementation or receiving a blood transfusions. She denies any nausea, vomiting or abdominal pain. She reports constipation that is chronic in nature. She denies any signs of overt bleeding including hematochezia or melena. She denies fevers, chills, sweats, shortness of breath, chest pain, cough, headaches, dizziness or skin changes. She has no other complaints. Rest of the ROS is below.   MEDICAL HISTORY:  Past Medical History:  Diagnosis Date   Hyperlipidemia    Hypertension     SURGICAL HISTORY: Past Surgical History:  Procedure Laterality Date   DILATION AND CURETTAGE OF UTERUS     ENDOMETRIAL ABLATION     PLACEMENT OF BREAST IMPLANTS      SOCIAL HISTORY: Social History   Socioeconomic History   Marital status: Single    Spouse name: Not on file   Number of children: Not on file   Years of education: Not on file   Highest education level: Not on file  Occupational History   Not on file  Tobacco Use   Smoking status: Never   Smokeless tobacco: Never  Substance and Sexual Activity   Alcohol use: Yes     Alcohol/week: 6.0 standard drinks of alcohol    Types: 6 Cans of beer per week   Drug use: Never   Sexual activity: Not on file  Other Topics Concern   Not on file  Social History Narrative   Not on file   Social Determinants of Health   Financial Resource Strain: Not on file  Food Insecurity: No Food Insecurity (04/28/2023)   Hunger Vital Sign    Worried About Running Out of Food in the Last Year: Never true    Ran Out of Food in the Last Year: Never true  Transportation Needs: No Transportation Needs (04/28/2023)   PRAPARE - Administrator, Civil Service (Medical): No    Lack of Transportation (Non-Medical): No  Physical Activity: Not on file  Stress: Not on file  Social Connections: Not on file  Intimate Partner Violence: Not At Risk (04/28/2023)   Humiliation, Afraid, Rape, and Kick questionnaire    Fear of Current or Ex-Partner: No    Emotionally Abused: No    Physically Abused: No    Sexually Abused: No    FAMILY HISTORY: History reviewed. No pertinent family history.  ALLERGIES:  has No Known Allergies.  MEDICATIONS:  Current Outpatient Medications  Medication Sig Dispense Refill   azelastine (ASTELIN) 0.1 % nasal spray Place into both nostrils.     buPROPion (WELLBUTRIN XL) 150 MG 24 hr tablet Take 150 mg by mouth every morning.     diazepam (VALIUM) 5 MG tablet Take 1 by mouth 1 hour  pre-procedure with  very light food. May bring 2nd tablet to appointment. 2 tablet 0   EPINEPHrine 0.3 mg/0.3 mL IJ SOAJ injection See admin instructions.     escitalopram (LEXAPRO) 20 MG tablet Take 20 mg by mouth daily.     ezetimibe (ZETIA) 10 MG tablet Take 10 mg by mouth daily.     meloxicam (MOBIC) 15 MG tablet TAKE 1 TABLET BY MOUTH EVERY DAY AS NEEDED 60 tablet 0   methocarbamol (ROBAXIN) 500 MG tablet Take 1 tablet (500 mg total) by mouth every 8 (eight) hours as needed for muscle spasms. 30 tablet 0   olmesartan-hydrochlorothiazide (BENICAR HCT) 20-12.5 MG tablet  Take 1 tablet by mouth daily.     predniSONE (STERAPRED UNI-PAK 21 TAB) 5 MG (21) TBPK tablet Take dosepak as directed 21 tablet 0   predniSONE (STERAPRED UNI-PAK 21 TAB) 5 MG (21) TBPK tablet Take dosepak as directed 21 tablet 0   zolpidem (AMBIEN) 5 MG tablet Take 5 mg by mouth at bedtime as needed.     No current facility-administered medications for this visit.    REVIEW OF SYSTEMS:   Constitutional: ( - ) fevers, ( - )  chills , ( - ) night sweats Eyes: ( - ) blurriness of vision, ( - ) double vision, ( - ) watery eyes Ears, nose, mouth, throat, and face: ( - ) mucositis, ( - ) sore throat Respiratory: ( - ) cough, ( - ) dyspnea, ( - ) wheezes Cardiovascular: ( - ) palpitation, ( - ) chest discomfort, ( - ) lower extremity swelling Gastrointestinal:  ( - ) nausea, ( - ) heartburn, ( - ) change in bowel habits Skin: ( - ) abnormal skin rashes Lymphatics: ( - ) new lymphadenopathy, ( - ) easy bruising Neurological: ( - ) numbness, ( - ) tingling, ( - ) new weaknesses Behavioral/Psych: ( - ) mood change, ( - ) new changes  All other systems were reviewed with the patient and are negative.  PHYSICAL EXAMINATION: ECOG PERFORMANCE STATUS: 0 - Asymptomatic  Vitals:   04/28/23 0923  BP: 138/62  Pulse: 68  Resp: 18  Temp: 98 F (36.7 C)  SpO2: 100%   Filed Weights   04/28/23 0923  Weight: 184 lb 4.8 oz (83.6 kg)    GENERAL: well appearing female in NAD  SKIN: skin color, texture, turgor are normal, no rashes or significant lesions EYES: conjunctiva are pink and non-injected, sclera clear OROPHARYNX: no exudate, no erythema; lips, buccal mucosa, and tongue normal  LUNGS: clear to auscultation and percussion with normal breathing effort HEART: regular rate & rhythm and no murmurs and no lower extremity edema Musculoskeletal: no cyanosis of digits and no clubbing  PSYCH: alert & oriented x 3, fluent speech NEURO: no focal motor/sensory deficits  LABORATORY DATA:  I have  reviewed the data as listed    Latest Ref Rng & Units 04/28/2023   10:21 AM  CBC  WBC 4.0 - 10.5 K/uL 4.9   Hemoglobin 12.0 - 15.0 g/dL 81.1   Hematocrit 91.4 - 46.0 % 36.5   Platelets 150 - 400 K/uL 254        Latest Ref Rng & Units 04/28/2023   10:21 AM  CMP  Glucose 70 - 99 mg/dL 98   BUN 8 - 23 mg/dL 13   Creatinine 7.82 - 1.00 mg/dL 9.56   Sodium 213 - 086 mmol/L 137   Potassium 3.5 - 5.1 mmol/L 3.9   Chloride 98 - 111 mmol/L  103   CO2 22 - 32 mmol/L 28   Calcium 8.9 - 10.3 mg/dL 8.7   Total Protein 6.5 - 8.1 g/dL 5.9   Total Bilirubin 0.3 - 1.2 mg/dL 0.4   Alkaline Phos 38 - 126 U/L 56   AST 15 - 41 U/L 19   ALT 0 - 44 U/L 17      ASSESSMENT & PLAN Barbara Glenn is 62 y.o. female who presents to the hematology clinic for evaluation of elevated ferritin levels.   Elevated serum ferritin levels have numerous possible etiologies. These include hereditary hemochromatosis (heterozygous or homozygous), inflammation, liver disease, or iron overload from an exogenous source. Hereditary hemochromatosis is a hereditary condition caused by mutations in the HFE gene, which regulates iron absorption. The most common genes mutated in this condition are the C282Y and H63D genes. Homozygous mutations represent a disease state which requires phlebotomy to decrease ferritin levels to a goal of <50  (Blood (2010) 116 (3): 317-325). The goal is to decrease ferritin so there is no deposition in critical organs (liver, heart, pancreas and thyroid). Heterozygous mutations (or compound heterozygotes) rarely require phlebotomy, but do have elevated serum iron/ferritin levels.  Ferritin is an acute phase reactant and can be elevated with systemic inflammation. Direct damage to liver tissue can also cause spillage of ferritin into the blood, resulting in elevated ferritin.  Additionally, serum iron levels can be quite transient and an elevation or serum iron may not represent a true overload of total body  iron (best lab for this is ferritin).   # Elevated Iron/Ferritin --labs to include CBC, CMP, ESR, CRP --will repeat iron panel and ferritin today --will send for HFE gene mutation. If found to have homozygous mutation for HFE will begin phlebotomies every other week with goal ferritin <50  --will order US liver to assess for liver disease --if patient confirmed to have hereditary hemochromatosis will order TSH, TTE, and Hepatitis B/C panels.  --RTC pending results of above studies.   Orders Placed This Encounter  Procedures   CBC with Differential (Cancer Center Only)    Standing Status:   Future    Number of Occurrences:   1    Standing Expiration Date:   04/27/2024   CMP (Cancer Center only)    Standing Status:   Future    Number of Occurrences:   1    Standing Expiration Date:   04/27/2024   Ferritin    Standing Status:   Future    Number of Occurrences:   1    Standing Expiration Date:   04/27/2024   Iron and Iron Binding Capacity ([facility] only)    Standing Status:   Future    Number of Occurrences:   1    Standing Expiration Date:   04/27/2024   Sedimentation rate    Standing Status:   Future    Number of Occurrences:   1    Standing Expiration Date:   04/27/2024   C-reactive protein    Standing Status:   Future    Number of Occurrences:   1    Standing Expiration Date:   04/27/2024   Hemochromatosis DNA, PCR    Standing Status:   Future    Number of Occurrences:   1    Standing Expiration Date:   04/27/2024    All questions were answered. The patient knows to call the clinic with any problems, questions or concerns.  I have spent a total of 60 minutes  minutes of face-to-face and non-face-to-face time, preparing to see the patient, obtaining and/or reviewing separately obtained history, performing a medically appropriate examination, counseling and educating the patient, ordering tests/procedures, documenting clinical information in the electronic health record,  and care  coordination.   Georga Kaufmann, PA-C Department of Hematology/Oncology [facility] at [facility] Phone: [phone]  Patient was seen with Dr. Leonides Schanz  I have read the above note and personally examined the patient. I agree with the assessment and plan as noted above.  Briefly Barbara Glenn is a 62 year old female who presents for evaluation of elevated ferritin levels.  At last assessment her ferritin was 598.  There are multiple possible etiologies for elevated ferritin.  We will order ESR and CRP to rule out underlying inflammation.  Additionally we will order hemochromatosis testing to assure the patient does not have a mutation causing elevated levels of iron in the body.  Furthermore would recommend imaging of the liver in order to rule out underlying liver disease.  The patient voiced understanding of the differential and our current findings.  Will plan to see her back pending the results of the above labs.   Ulysees Barns, MD Department of Hematology/Oncology [facility] at [facility] Phone: [phone] Pager: [phone] Email: [email or web address]

## 2023-04-28 ENCOUNTER — Other Ambulatory Visit: Payer: Self-pay

## 2023-04-28 ENCOUNTER — Inpatient Hospital Stay: Payer: BC Managed Care – PPO | Attending: Physician Assistant | Admitting: Physician Assistant

## 2023-04-28 ENCOUNTER — Inpatient Hospital Stay: Payer: BC Managed Care – PPO

## 2023-04-28 ENCOUNTER — Encounter: Payer: Self-pay | Admitting: Physician Assistant

## 2023-04-28 VITALS — BP 138/62 | HR 68 | Temp 98.0°F | Resp 18 | Wt 184.3 lb

## 2023-04-28 DIAGNOSIS — R7989 Other specified abnormal findings of blood chemistry: Secondary | ICD-10-CM

## 2023-04-28 DIAGNOSIS — E785 Hyperlipidemia, unspecified: Secondary | ICD-10-CM | POA: Diagnosis not present

## 2023-04-28 DIAGNOSIS — Z79899 Other long term (current) drug therapy: Secondary | ICD-10-CM | POA: Insufficient documentation

## 2023-04-28 DIAGNOSIS — J301 Allergic rhinitis due to pollen: Secondary | ICD-10-CM | POA: Diagnosis not present

## 2023-04-28 DIAGNOSIS — J3081 Allergic rhinitis due to animal (cat) (dog) hair and dander: Secondary | ICD-10-CM | POA: Diagnosis not present

## 2023-04-28 DIAGNOSIS — I10 Essential (primary) hypertension: Secondary | ICD-10-CM | POA: Diagnosis not present

## 2023-04-28 DIAGNOSIS — K5900 Constipation, unspecified: Secondary | ICD-10-CM | POA: Insufficient documentation

## 2023-04-28 DIAGNOSIS — J3089 Other allergic rhinitis: Secondary | ICD-10-CM | POA: Diagnosis not present

## 2023-04-28 LAB — CBC WITH DIFFERENTIAL (CANCER CENTER ONLY)
Abs Immature Granulocytes: 0.01 10*3/uL (ref 0.00–0.07)
Basophils Absolute: 0 10*3/uL (ref 0.0–0.1)
Basophils Relative: 1 %
Eosinophils Absolute: 0.1 10*3/uL (ref 0.0–0.5)
Eosinophils Relative: 2 %
HCT: 36.5 % (ref 36.0–46.0)
Hemoglobin: 12.6 g/dL (ref 12.0–15.0)
Immature Granulocytes: 0 %
Lymphocytes Relative: 29 %
Lymphs Abs: 1.4 10*3/uL (ref 0.7–4.0)
MCH: 32.5 pg (ref 26.0–34.0)
MCHC: 34.5 g/dL (ref 30.0–36.0)
MCV: 94.1 fL (ref 80.0–100.0)
Monocytes Absolute: 0.4 10*3/uL (ref 0.1–1.0)
Monocytes Relative: 8 %
Neutro Abs: 3 10*3/uL (ref 1.7–7.7)
Neutrophils Relative %: 60 %
Platelet Count: 254 10*3/uL (ref 150–400)
RBC: 3.88 MIL/uL (ref 3.87–5.11)
RDW: 12 % (ref 11.5–15.5)
WBC Count: 4.9 10*3/uL (ref 4.0–10.5)
nRBC: 0 % (ref 0.0–0.2)

## 2023-04-28 LAB — CMP (CANCER CENTER ONLY)
ALT: 17 U/L (ref 0–44)
AST: 19 U/L (ref 15–41)
Albumin: 4 g/dL (ref 3.5–5.0)
Alkaline Phosphatase: 56 U/L (ref 38–126)
Anion gap: 6 (ref 5–15)
BUN: 13 mg/dL (ref 8–23)
CO2: 28 mmol/L (ref 22–32)
Calcium: 8.7 mg/dL — ABNORMAL LOW (ref 8.9–10.3)
Chloride: 103 mmol/L (ref 98–111)
Creatinine: 0.79 mg/dL (ref 0.44–1.00)
GFR, Estimated: >60 mL/min (ref >60)
Glucose, Bld: 98 mg/dL (ref 70–99)
Potassium: 3.9 mmol/L (ref 3.5–5.1)
Sodium: 137 mmol/L (ref 135–145)
Total Bilirubin: 0.4 mg/dL (ref 0.3–1.2)
Total Protein: 5.9 g/dL — ABNORMAL LOW (ref 6.5–8.1)

## 2023-04-28 LAB — FERRITIN: Ferritin: 338 ng/mL — ABNORMAL HIGH (ref 11–307)

## 2023-04-28 LAB — IRON AND IRON BINDING CAPACITY (CC-WL,HP ONLY)
Iron: 99 ug/dL (ref 28–170)
Saturation Ratios: 35 % — ABNORMAL HIGH (ref 10.4–31.8)
TIBC: 280 ug/dL (ref 250–450)
UIBC: 181 ug/dL (ref 148–442)

## 2023-04-28 LAB — SEDIMENTATION RATE: Sed Rate: 4 mm/hr (ref 0–22)

## 2023-04-28 LAB — C-REACTIVE PROTEIN: CRP: 0.5 mg/dL (ref <1.0)

## 2023-05-04 ENCOUNTER — Ambulatory Visit (INDEPENDENT_AMBULATORY_CARE_PROVIDER_SITE_OTHER): Payer: BC Managed Care – PPO | Admitting: Orthopedic Surgery

## 2023-05-04 ENCOUNTER — Other Ambulatory Visit: Payer: Self-pay

## 2023-05-04 DIAGNOSIS — M25571 Pain in right ankle and joints of right foot: Secondary | ICD-10-CM

## 2023-05-04 DIAGNOSIS — M7660 Achilles tendinitis, unspecified leg: Secondary | ICD-10-CM

## 2023-05-05 ENCOUNTER — Encounter: Payer: Self-pay | Admitting: Orthopedic Surgery

## 2023-05-05 NOTE — Progress Notes (Signed)
Office Visit Note   Patient: Barbara Glenn           Date of Birth: 11-30-1960           MRN: [id number] Visit Date: 05/04/2023 Requested by: No referring provider defined for this encounter. PCP: Pcp, No  Subjective: Chief Complaint  Patient presents with   Right Ankle - Pain    HPI: Barbara Glenn is a 62 y.o. female who presents to the office reporting right ankle pain.  I have seen Barbara Glenn in the past for Achilles tendinitis which appears insertional.  Was getting better but had recurrence of pain about 2 months ago when she did an aerobics class on 1 occasion only.  Feels like she may have reinjured it.  The pain comes and goes.  She has good and bad days but when the pain is present she hurts with every step.  Pain does not wake her from sleep at night.  Denies much in the way of swelling.  Does hurt her with stairs.  She has tried stretching over-the-counter medication adjusting shoewear as well as activity modification.  Symptoms ongoing now for about 9 months but worse over the past 2 months since reinjury..                ROS: All systems reviewed are negative as they relate to the chief complaint within the history of present illness.  Patient denies fevers or chills.  Assessment & Plan: Visit Diagnoses:  1. 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  Plan is MRI scan to evaluate, which tendinosis is present.  Continue with heel cord stretching although she is pretty limber at this time.  I think she would be a good candidate for ultrasound energy delivery with Dr. Shon Baton.  We will order MRI scan of the ankle and have her follow-up with him.  Follow-Up Instructions: No follow-ups on file.   Orders:  Orders Placed This Encounter  Procedures   MR Ankle Right w/o contrast   No orders of the defined types were placed in this encounter.     Procedures: No  procedures performed   Clinical Data: No additional findings.  Objective: Vital Signs: There were no vitals taken for this visit.  Physical Exam:  Constitutional: Patient appears well-developed HEENT:  Head: Normocephalic Eyes:EOM are normal Neck: Normal range of motion Cardiovascular: Normal rate Pulmonary/chest: Effort normal Neurologic: Patient is alert Skin: Skin is warm Psychiatric: Patient has normal mood and affect  Ortho Exam: Ortho exam demonstrates tenderness of the Achilles tendon at its attachment site onto the calcaneus.  Small Haglund's deformity is present.  She has excellent plantarflexion strength bilaterally.  Heel cord not excessively tight as she has about 20 degrees of ankle dorsiflexion minimum with the knee extended.  Tibiotalar subtalar transverse tarsal range of motion intact.  Pedal pulses palpable.  Does not feel like there is really any peritenon inflammation around the right Achilles tendon.  Specialty Comments:  No specialty comments available.  Imaging: No results found.   PMFS History: There are no problems to display for this patient.  Past Medical History:  Diagnosis Date   Hyperlipidemia    Hypertension     No family history on file.  Past Surgical History:  Procedure Laterality Date   DILATION AND CURETTAGE OF UTERUS     ENDOMETRIAL ABLATION  PLACEMENT OF BREAST IMPLANTS     Social History   Occupational History   Not on file  Tobacco Use   Smoking status: Never   Smokeless tobacco: Never  Substance and Sexual Activity   Alcohol use: Yes    Alcohol/week: 6.0 standard drinks of alcohol    Types: 6 Cans of beer per week   Drug use: Never   Sexual activity: Not on file

## 2023-05-07 DIAGNOSIS — J3089 Other allergic rhinitis: Secondary | ICD-10-CM | POA: Diagnosis not present

## 2023-05-14 ENCOUNTER — Ambulatory Visit
Admission: RE | Admit: 2023-05-14 | Discharge: 2023-05-14 | Disposition: A | Discharge status: Home / Self Care | Payer: BC Managed Care – PPO | Source: Ambulatory Visit | Attending: Orthopedic Surgery | Admitting: Orthopedic Surgery

## 2023-05-14 DIAGNOSIS — M25571 Pain in right ankle and joints of right foot: Secondary | ICD-10-CM | POA: Diagnosis not present

## 2023-05-14 DIAGNOSIS — M67873 Other specified disorders of tendon, right ankle and foot: Secondary | ICD-10-CM | POA: Diagnosis not present

## 2023-05-14 LAB — HEMOCHROMATOSIS DNA-PCR(C282Y,H63D)

## 2023-05-15 DIAGNOSIS — J3089 Other allergic rhinitis: Secondary | ICD-10-CM | POA: Diagnosis not present

## 2023-05-15 DIAGNOSIS — J301 Allergic rhinitis due to pollen: Secondary | ICD-10-CM | POA: Diagnosis not present

## 2023-05-15 DIAGNOSIS — J3081 Allergic rhinitis due to animal (cat) (dog) hair and dander: Secondary | ICD-10-CM | POA: Diagnosis not present

## 2023-05-17 ENCOUNTER — Other Ambulatory Visit: Payer: BC Managed Care – PPO

## 2023-05-18 ENCOUNTER — Telehealth: Payer: Self-pay | Admitting: Physician Assistant

## 2023-05-18 NOTE — Telephone Encounter (Signed)
I called [name] reviewed lab results from 04/28/2023.  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  In the with the heterozygous mutation, there will be elevated ferritin levels but is generally not associated with organ dysfunction or clinical symptoms.  Recommend routine monitoring of ferritin levels.  There is no indication to initiate therapeutic phlebotomies.  If ferritin levels start to increase around 1000 ng/mL, we will evaluate for other causes such as liver disease and inflammatory process.   Patient will return in 6 months with repeat labs to monitor stability of ferritin levels.  There is no significant change in ferritin levels, we will discharge patient to PCP who can monitor patient with serial ferritin levels.  Patient expressed understanding and satisfaction with the plan provided.

## 2023-05-22 DIAGNOSIS — J301 Allergic rhinitis due to pollen: Secondary | ICD-10-CM | POA: Diagnosis not present

## 2023-05-22 DIAGNOSIS — J3081 Allergic rhinitis due to animal (cat) (dog) hair and dander: Secondary | ICD-10-CM | POA: Diagnosis not present

## 2023-05-22 DIAGNOSIS — J3089 Other allergic rhinitis: Secondary | ICD-10-CM | POA: Diagnosis not present

## 2023-05-25 ENCOUNTER — Encounter: Payer: Self-pay | Admitting: Sports Medicine

## 2023-05-25 ENCOUNTER — Ambulatory Visit (INDEPENDENT_AMBULATORY_CARE_PROVIDER_SITE_OTHER): Payer: BC Managed Care – PPO | Admitting: Sports Medicine

## 2023-05-25 DIAGNOSIS — M6788 Other specified disorders of synovium and tendon, other site: Secondary | ICD-10-CM

## 2023-05-25 DIAGNOSIS — M25571 Pain in right ankle and joints of right foot: Secondary | ICD-10-CM | POA: Diagnosis not present

## 2023-05-25 DIAGNOSIS — M7731 Calcaneal spur, right foot: Secondary | ICD-10-CM | POA: Diagnosis not present

## 2023-05-25 NOTE — Progress Notes (Signed)
Barbara Glenn - 62 y.o. female MRN [id number]  Date of birth: 1961/04/11  Office Visit Note: Visit Date: 05/25/2023 PCP: Pcp, No Referred by: Cammy Copa, MD  Subjective: Chief Complaint  Patient presents with   Right Foot - Pain   HPI: Barbara Glenn is a pleasant 62 y.o. female who presents today for acute on chronic right heel/achilles pain.  Has been seeing Dr. August Saucer for this, he did order MRI encouraged her to follow-up with me for review and discussion on extracorporeal shockwave therapy.  She has had pain in this area since September, no specific injury.  Pain has waxed and waned since then.  Has pain after walking or standing for prolonged period of time.  She does use topical Voltaren gel and stretching of the heel cord with minimal relief.  Has not had any formalized physical therapy yet to date.  Pertinent ROS were reviewed with the patient and found to be negative unless otherwise specified above in HPI.   Assessment & Plan: Visit Diagnoses:  1. Achilles tendinosis of right lower extremity   2. Pain in right ankle and joints of right foot   3. Calcaneal spur of right foot    Plan: Discussed with Reuel Boom based on her symptoms and her MRI scan, that she is dealing with distal Achilles tendinopathy, MRI does show some bone marrow edema at the insertion.  There is no evidence of tearing at the Achilles.  She has very good range of motion, so we will stop heel cord stretching at this time.  There is a decision making to proceed with a trial of extracorporeal shockwave therapy.  She will follow-up with me in 1 week for an additional treatment and we will see what degree of improvement she receives before discussion of additional treatments.  Ultimately may need to get her into formalized physical therapy, but I would like her to stop stretching and irritating the area while we perform initial ESWT.  Could consider small heel lift to help offload the Achilles tendon.  Given her  bone marrow edema, we did discuss the nature of a short-term walking boot to offload, will hold for now. F/u in 1 week.  Follow-up: Return in about 1 week (around 06/01/2023) for for right achilles/heel (reg visit).   Meds & Orders: No orders of the defined types were placed in this encounter.  No orders of the defined types were placed in this encounter.    Procedures: Procedure: ECSWT Indications:     Procedure Details Consent: Risks of procedure as well as the alternatives and risks of each were explained to the patient.  Verbal consent for procedure obtained. Time Out: Verified patient identification, verified procedure, site was marked, verified correct patient position. The area was cleaned with alcohol swab.     The right Achilles and superior calcaneus was targeted for Extracorporeal shockwave therapy.    Preset: Achillodynia Power Level: 60-90 mJ Frequency: 10 Hz Impulse/cycles: 2500 Head size: Regular   Patient tolerated procedure well without immediate complications.         Clinical History: No specialty comments available.  She reports that she has never smoked. She has never used smokeless tobacco. No results for input(s): "HGBA1C", "LABURIC" in the last 8760 hours.  Objective:   Vital Signs: There were no vitals taken for this visit.  Physical Exam  Gen: Well-appearing, in no acute distress; non-toxic CV: Well-perfused. Warm.  Resp: Breathing unlabored on room air; no wheezing. Psych: Fluid speech  in conversation; appropriate affect; normal thought process Neuro: Sensation intact throughout. No gross coordination deficits.   Ortho Exam - Right foot/ankle: + TTP over the superior aspect of the calcaneus and the distal Achilles insertion.  No nodular thickening.  Intact Thompson squeeze test.  There is full range of motion with plantarflexion and dorsiflexion.  No redness or swelling.  Imaging:  MR Ankle Right w/o contrast CLINICAL DATA:  Right ankle  pain  EXAM: MRI OF THE RIGHT ANKLE WITHOUT CONTRAST  TECHNIQUE: Multiplanar, multisequence MR imaging of the ankle was performed. No intravenous contrast was administered.  COMPARISON:  None Available.  FINDINGS: TENDONS  Peroneal: Peroneal longus tendon intact. Moderate tendinosis of the peroneus brevis with a small partial-thickness tear just distal to the lateral malleolus.  Posteromedial: Posterior tibial tendon intact. Flexor hallucis longus tendon intact. Flexor digitorum longus tendon intact.  Anterior: Tibialis anterior tendon intact. Extensor hallucis longus tendon intact Extensor digitorum longus tendon intact.  Achilles: Moderate tendinosis of the Achilles tendon without a tear. Subcortical bone marrow edema at the Achilles insertion. Small amount of fluid in the retrocalcaneal bursa.  Plantar Fascia: Intact. Small plantar calcaneal spur.  LIGAMENTS  Lateral: Anterior talofibular ligament intact. Calcaneofibular ligament intact. Posterior talofibular ligament intact. Anterior and posterior tibiofibular ligaments intact.  Medial: Deltoid ligament intact. Spring ligament intact.  CARTILAGE  Ankle Joint: No joint effusion. Normal ankle mortise. No focal chondral defect. Mild subchondral reactive marrow changes focally in the medial corner of the talar dome.  Subtalar Joints/Sinus Tarsi: Normal subtalar joints. No subtalar joint effusion. Normal sinus tarsi.  Bones: No aggressive osseous lesion. No fracture or dislocation.  Soft Tissue: No fluid collection or hematoma. Muscles are normal without edema or atrophy. Tarsal tunnel is normal.  IMPRESSION: 1. Moderate tendinosis of the Achilles tendon without a tear and with subcortical bone marrow edema at the Achilles insertion. Mild retrocalcaneal bursitis. 2. Moderate tendinosis of the peroneus brevis with a small partial-thickness tear just distal to the lateral malleolus.  Electronically Signed   By:  Elige Ko M.D.   On: 05/24/2023 13:17    Past Medical/Family/Surgical/Social History: Medications & Allergies reviewed per EMR, new medications updated. There are no problems to display for this patient.  Past Medical History:  Diagnosis Date   Hyperlipidemia    Hypertension    No family history on file. Past Surgical History:  Procedure Laterality Date   DILATION AND CURETTAGE OF UTERUS     ENDOMETRIAL ABLATION     PLACEMENT OF BREAST IMPLANTS     Social History   Occupational History   Not on file  Tobacco Use   Smoking status: Never   Smokeless tobacco: Never  Substance and Sexual Activity   Alcohol use: Yes    Alcohol/week: 6.0 standard drinks of alcohol    Types: 6 Cans of beer per week   Drug use: Never   Sexual activity: Not on file

## 2023-05-25 NOTE — Progress Notes (Signed)
Injury in September Has not had any type of treatment  Using voltaren gel

## 2023-05-26 ENCOUNTER — Telehealth: Payer: Self-pay

## 2023-05-26 NOTE — Telephone Encounter (Signed)
Dr August Saucer called and LMVM for patient with MRI results

## 2023-05-26 NOTE — Telephone Encounter (Signed)
-----   Message from Burnard Bunting sent at 05/26/2023 11:38 AM EDT ----- I called and left message on machine about her results.  I think she has some tendinosis but nothing that is operative.  I think doing eccentric strengthening and seeing Barbara Glenn for some ultrasound treatment would be her best bet.  Avoiding loadbearing  type exercises and walking until she feels great is also a good idea

## 2023-05-26 NOTE — Progress Notes (Signed)
I called and left message on machine about her results.  I think she has some tendinosis but nothing that is operative.  I think doing eccentric strengthening and seeing Barbara Glenn for some ultrasound treatment would be her best bet.  Avoiding loadbearing type exercises and walking until she feels great is also a good idea

## 2023-05-29 DIAGNOSIS — J3089 Other allergic rhinitis: Secondary | ICD-10-CM | POA: Diagnosis not present

## 2023-05-29 DIAGNOSIS — J3081 Allergic rhinitis due to animal (cat) (dog) hair and dander: Secondary | ICD-10-CM | POA: Diagnosis not present

## 2023-05-29 DIAGNOSIS — R21 Rash and other nonspecific skin eruption: Secondary | ICD-10-CM | POA: Diagnosis not present

## 2023-05-29 DIAGNOSIS — J301 Allergic rhinitis due to pollen: Secondary | ICD-10-CM | POA: Diagnosis not present

## 2023-06-03 ENCOUNTER — Ambulatory Visit (INDEPENDENT_AMBULATORY_CARE_PROVIDER_SITE_OTHER): Payer: BC Managed Care – PPO | Admitting: Sports Medicine

## 2023-06-03 ENCOUNTER — Encounter: Payer: Self-pay | Admitting: Sports Medicine

## 2023-06-03 VITALS — Ht 68.0 in | Wt 184.0 lb

## 2023-06-03 DIAGNOSIS — M6788 Other specified disorders of synovium and tendon, other site: Secondary | ICD-10-CM | POA: Diagnosis not present

## 2023-06-03 DIAGNOSIS — M7731 Calcaneal spur, right foot: Secondary | ICD-10-CM | POA: Diagnosis not present

## 2023-06-03 DIAGNOSIS — M25571 Pain in right ankle and joints of right foot: Secondary | ICD-10-CM | POA: Diagnosis not present

## 2023-06-03 MED ORDER — MELOXICAM 15 MG PO TABS: ORAL_TABLET | ORAL | 0 refills | Status: DC

## 2023-06-03 NOTE — Progress Notes (Signed)
Barbara Glenn - 62 y.o. female MRN [id number]  Date of birth: Oct 16, 1961  Office Visit Note: Visit Date: 06/03/2023 PCP: Pcp, No Referred by: No ref. provider found  Subjective: Chief Complaint  Patient presents with   Right Ankle - Pain    Patient returns for right ankle follow up. She had shockwave therapy last week and noticed relief x 2 days. She hosted family for several days and was on her foot a lot. She is not taking anything for pain. She does use voltaren gel occasionally.  She has tried Advil but is unsure if that helps.   HPI: Barbara Glenn is a pleasant 62 y.o. female who presents today for follow-up of acute on chronic right heel and Achilles pain.  She did notice some improvement after shockwave therapy for the first few days, however she been posted family function and was on her feet a lot, feels like this worsened her pain.  Using Voltaren gel and Advil as needed, unsure of the relief of her pain.  Pertinent ROS were reviewed with the patient and found to be negative unless otherwise specified above in HPI.   Assessment & Plan: Visit Diagnoses:  1. Achilles tendinosis of right lower extremity   2. Calcaneal spur of right foot   3. Pain in right ankle and joints of right foot    Plan: Discussed with [name] treatment options for her insertional Achilles tendinosis -some initial relief from shockwave therapy, however a lot of stress and strain on the ankle with hosting family function.  We did repeat extracorporeal shockwave therapy today.  We discussed activity modification, I would like her to continue her exercise at the gym but will work more on weights, cycling, limited elliptical use.  She will stop walking, treadmill and prolonged standing at this time.  We will refill her meloxicam 15 mg and she will start this once daily only for the next few weeks.  She also will bring her tennis shoes at next visit and we will fit her for some heel lifts to help offload the  Achilles.  Will send her for formalized physical therapy.  Given the bone marrow edema at the Achilles insertion, we discussed a brief trial of cam walker boot if this is not settle down with above treatments, we will hold on this for now.  Follow-up: Return in about 1 week (around 06/10/2023) for R-achilles/heel .   Meds & Orders:  Meds ordered this encounter  Medications   meloxicam (MOBIC) 15 MG tablet    Sig: TAKE 1 TABLET BY MOUTH EVERY DAY AS NEEDED    Dispense:  30 tablet    Refill:  0    Orders Placed This Encounter  Procedures   Ambulatory referral to Physical Therapy     Procedures: Procedure: ECSWT Indications:     Procedure Details Consent: Risks of procedure as well as the alternatives and risks of each were explained to the patient.  Verbal consent for procedure obtained. Time Out: Verified patient identification, verified procedure, site was marked, verified correct patient position. The area was cleaned with alcohol swab.     The right Achilles and superior calcaneus was targeted for Extracorporeal shockwave therapy.    Preset: Achillodynia Power Level: 80-90 mJ Frequency: 10 Hz Impulse/cycles: 2800 (300 at superior calcaneus) Head size: Regular   Patient tolerated procedure well without immediate complications.      Clinical History: No specialty comments available.  She reports that she has never smoked. She  has never used smokeless tobacco. No results for input(s): "HGBA1C", "LABURIC" in the last 8760 hours.  Objective:   Vital Signs: Ht 5\' 8"  (1.727 m)   Wt 184 lb (83.5 kg)   BMI 27.98 kg/m   Physical Exam  Gen: Well-appearing, in no acute distress; non-toxic CV:  Well-perfused. Warm.  Resp: Breathing unlabored on room air; no wheezing. Psych: Fluid speech in conversation; appropriate affect; normal thought process Neuro: Sensation intact throughout. No gross coordination deficits.   Ortho Exam -Positive TTP over the superior aspect of the  calcaneus and distal Achilles insertion.  There is a negative calcaneal heel squeeze however.  Full range of motion with plantarflexion and dorsiflexion.  No effusion about the ankle.  Imaging: No results found.  Past Medical/Family/Surgical/Social History: Medications & Allergies reviewed per EMR, new medications updated. There are no problems to display for this patient.  Past Medical History:  Diagnosis Date   Hyperlipidemia    Hypertension    History reviewed. No pertinent family history. Past Surgical History:  Procedure Laterality Date   DILATION AND CURETTAGE OF UTERUS     ENDOMETRIAL ABLATION     PLACEMENT OF BREAST IMPLANTS     Social History   Occupational History   Not on file  Tobacco Use   Smoking status: Never   Smokeless tobacco: Never  Substance and Sexual Activity   Alcohol use: Yes    Alcohol/week: 6.0 standard drinks of alcohol    Types: 6 Cans of beer per week   Drug use: Never   Sexual activity: Not on file

## 2023-06-05 DIAGNOSIS — J3089 Other allergic rhinitis: Secondary | ICD-10-CM | POA: Diagnosis not present

## 2023-06-12 ENCOUNTER — Ambulatory Visit: Payer: BC Managed Care – PPO | Admitting: Sports Medicine

## 2023-06-12 ENCOUNTER — Encounter: Payer: Self-pay | Admitting: Sports Medicine

## 2023-06-12 DIAGNOSIS — M6788 Other specified disorders of synovium and tendon, other site: Secondary | ICD-10-CM | POA: Diagnosis not present

## 2023-06-12 DIAGNOSIS — M7731 Calcaneal spur, right foot: Secondary | ICD-10-CM | POA: Diagnosis not present

## 2023-06-12 DIAGNOSIS — J3081 Allergic rhinitis due to animal (cat) (dog) hair and dander: Secondary | ICD-10-CM | POA: Diagnosis not present

## 2023-06-12 DIAGNOSIS — M25571 Pain in right ankle and joints of right foot: Secondary | ICD-10-CM | POA: Diagnosis not present

## 2023-06-12 DIAGNOSIS — J3089 Other allergic rhinitis: Secondary | ICD-10-CM | POA: Diagnosis not present

## 2023-06-12 DIAGNOSIS — J301 Allergic rhinitis due to pollen: Secondary | ICD-10-CM | POA: Diagnosis not present

## 2023-06-12 NOTE — Progress Notes (Signed)
Barbara Glenn - 62 y.o. female MRN [id number]  Date of birth: [birth year]  Office Visit Note: Visit Date: 06/12/2023 PCP: Pcp, No Referred by: No ref. provider found  Subjective: Chief Complaint  Patient presents with   Right Foot - Follow-up   HPI: Barbara Glenn is a pleasant 62 y.o. female who presents today for follow-up of acute on chronic right heel and Achilles pain.  We have performed 2 sessions of extracorporeal shockwave therapy, she does feel like she is making good improvements with this, especially after the last shockwave session. At this time if she is about 60% improved.  She is asking about recommendations for appropriate shoes, her [employer] shoes are relatively old.  She is taking meloxicam 15 mg once daily and does notice improvement with this.  Pertinent ROS were reviewed with the patient and found to be negative unless otherwise specified above in HPI.   Assessment & Plan: Visit Diagnoses:  1. Achilles tendinosis of right lower extremity   2. Calcaneal spur of right foot   3. Pain in right ankle and joints of right foot    Plan: Barbara Glenn has made significant improvement after 2 sessions of extracorporeal shockwave therapy and activity modification. We did repeat use ESWT today.  We did evaluate her gait, did place 3/16 inch heel lifts into both shoes and attempt to offload the distal Achilles. She also has 5/16 inch heel lifts, may alternate depending on her comfort. For now she will continue meloxicam 15 mg once daily, we will likely stop this in the next 1-2 weeks depending on her improvement.  She will follow-up next week for repeat shockwave and further evaluation.  Follow-up: Return in about 1 week (around 06/19/2023) for for R-achilles.   Meds & Orders: No orders of the defined types were placed in this encounter.  No orders of the defined types were placed in this encounter.    Procedures: No procedures performed      Clinical History: No specialty comments  available.  She reports that she has never smoked. She has never used smokeless tobacco. No results for input(s): "HGBA1C", "LABURIC" in the last 8760 hours.  Objective:   Physical Exam  Gen: Well-appearing, in no acute distress; non-toxic CV: Well-perfused. Warm.  Resp: Breathing unlabored on room air; no wheezing. Psych: Fluid speech in conversation; appropriate affect; normal thought process Neuro: Sensation intact throughout. No gross coordination deficits.   Ortho Exam - Right foot/ankle: There is significantly improved TTP with only trivial TTP at the distal Achilles insertion.  Negative calcaneal heel squeeze.  Full range of motion with plantarflexion and dorsiflexion.  No redness or swelling about the ankle or foot.  Imaging:  MR Ankle Right w/o contrast CLINICAL DATA:  Right ankle pain  EXAM: MRI OF THE RIGHT ANKLE WITHOUT CONTRAST  TECHNIQUE: Multiplanar, multisequence MR imaging of the ankle was performed. No intravenous contrast was administered.  COMPARISON:  None Available.  FINDINGS: TENDONS  Peroneal: Peroneal longus tendon intact. Moderate tendinosis of the peroneus brevis with a small partial-thickness tear just distal to the lateral malleolus.  Posteromedial: Posterior tibial tendon intact. Flexor hallucis longus tendon intact. Flexor digitorum longus tendon intact.  Anterior: Tibialis anterior tendon intact. Extensor hallucis longus tendon intact Extensor digitorum longus tendon intact.  Achilles: Moderate tendinosis of the Achilles tendon without a tear. Subcortical bone marrow edema at the Achilles insertion. Small amount of fluid in the retrocalcaneal bursa.  Plantar Fascia: Intact. Small plantar calcaneal spur.  LIGAMENTS  Lateral: Anterior talofibular ligament intact. Calcaneofibular ligament intact. Posterior talofibular ligament intact. Anterior and posterior tibiofibular ligaments intact.  Medial: Deltoid ligament intact. Spring  ligament intact.  CARTILAGE  Ankle Joint: No joint effusion. Normal ankle mortise. No focal chondral defect. Mild subchondral reactive marrow changes focally in the medial corner of the talar dome.  Subtalar Joints/Sinus Tarsi: Normal subtalar joints. No subtalar joint effusion. Normal sinus tarsi.  Bones: No aggressive osseous lesion. No fracture or dislocation.  Soft Tissue: No fluid collection or hematoma. Muscles are normal without edema or atrophy. Tarsal tunnel is normal.  IMPRESSION: 1. Moderate tendinosis of the Achilles tendon without a tear and with subcortical bone marrow edema at the Achilles insertion. Mild retrocalcaneal bursitis. 2. Moderate tendinosis of the peroneus brevis with a small partial-thickness tear just distal to the lateral malleolus.  Electronically Signed   By: Elige Ko M.D.   On: 05/24/2023 13:17  Past Medical/Family/Surgical/Social History: Medications & Allergies reviewed per EMR, new medications updated. There are no problems to display for this patient.  Past Medical History:  Diagnosis Date   Hyperlipidemia    Hypertension    No family history on file. Past Surgical History:  Procedure Laterality Date   DILATION AND CURETTAGE OF UTERUS     ENDOMETRIAL ABLATION     PLACEMENT OF BREAST IMPLANTS     Social History   Occupational History   Not on file  Tobacco Use   Smoking status: Never   Smokeless tobacco: Never  Substance and Sexual Activity   Alcohol use: Yes    Alcohol/week: 6.0 standard drinks of alcohol    Types: 6 Cans of beer per week   Drug use: Never   Sexual activity: Not on file   I spent 33 minutes in the care of the patient today including face-to-face time, preparation to see the patient, as well as review of MRI and correlation with symptoms, discussion on physical activity modification, gait analysis with heel lift adjustments, shoewear recommendations for the above diagnoses.   Madelyn Brunner, DO Primary  Care Sports Medicine Physician  [facility]  This note was dictated using Dragon naturally speaking software and may contain errors in syntax, spelling, or content which have not been identified prior to signing this note.

## 2023-06-12 NOTE — Progress Notes (Signed)
Things are better after this last treatment it is a little painful today but overall things are good.

## 2023-06-18 DIAGNOSIS — J3089 Other allergic rhinitis: Secondary | ICD-10-CM | POA: Diagnosis not present

## 2023-06-18 NOTE — Therapy (Signed)
OUTPATIENT PHYSICAL THERAPY EVALUATION   Patient Name: SAKARI HAVRILLA MRN: [id number] DOB:September 03, 1961, 62 y.o., female Today's Date: 06/19/2023  END OF SESSION:  PT End of Session - 06/19/23 1056     Visit Number 1    Number of Visits 6    Date for PT Re-Evaluation 07/31/23    Authorization Type [employer] 30% coinsurance; 30 visit limit    PT Start Time 1010    PT Stop Time 1055    PT Time Calculation (min) 45 min    Activity Tolerance Patient tolerated treatment well             Past Medical History:  Diagnosis Date   Hyperlipidemia    Hypertension    Past Surgical History:  Procedure Laterality Date   DILATION AND CURETTAGE OF UTERUS     ENDOMETRIAL ABLATION     PLACEMENT OF BREAST IMPLANTS     There are no problems to display for this patient.   PCP: Pcp, No  REFERRING PROVIDER: Madelyn Brunner, DO  REFERRING DIAG: [phone] (ICD-10-CM) - Achilles tendinosis of right lower extremity  Rationale for Evaluation and Treatment: Rehabilitation  THERAPY DIAG:  Pain in right ankle and joints of right foot - Plan: PT plan of care cert/re-cert  Other symptoms and signs involving the musculoskeletal system - Plan: PT plan of care cert/re-cert  Dizziness and giddiness - Plan: PT plan of care cert/re-cert  ONSET DATE: Sept 2023   SUBJECTIVE:                                                                                                                                                                                           SUBJECTIVE STATEMENT: Pt with chronic Rt achilles pain for about a year.  She reports she missed the last step going into the garage rolling her ankle about a year ago.  She was doing some stretches off the step, but one time felt a "pop" but then felt better.  Pain improved but then returned when trying to do an aerobics class.  She's had ESWT which has helped.    PERTINENT HISTORY:  HLD, HTN  PAIN:  Are you having pain? Yes: NPRS scale: 0 currently,  5-6/10 Pain location: Rt heel/achilles Pain description: stretching, pulling Aggravating factors: [place]; worse in [place] Relieving factors: rest, shockwave therapy  PRECAUTIONS:  None  RED FLAGS: None   WEIGHT BEARING RESTRICTIONS:  No  FALLS:  Has patient fallen in last 6 months? 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 Moderate tendinosis of the Achilles tendon without a tear and with subcortical bone marrow edema at the Achilles insertion. Mild retrocalcaneal bursitis. 2. Moderate tendinosis of the peroneus brevis with a small partial-thickness tear just distal to the lateral malleolus.  PATIENT SURVEYS:  06/19/23: FOTO 56 (predicted 66)  COGNITIVE STATUS: Within functional limits for tasks assessed   SENSATION: WFL  POSTURE:  rounded shoulders and forward head  GAIT: 06/19/23 Comments: mild antalgic gait   PALPATION: Trigger points noted in Rt soleus  LOWER EXTREMITY ROM:     ROM Right eval Left eval  Ankle dorsiflexion A: 5 P:10   Ankle plantarflexion A: 59   Ankle inversion A: 22   Ankle eversion A: 13    (Blank rows = not tested)   LOWER EXTREMITY MMT:    MMT Right eval Left eval  Ankle dorsiflexion 5/5   Ankle plantarflexion 4/5   Ankle inversion 5/5   Ankle eversion 4/5    (Blank rows = not tested)    FUNCTIONAL TESTS:  06/19/23:  SLS: RLE: 5 sec; LLE: 7 sec   TREATMENT:                                                                                                                               DATE:  06/19/23 TherEx See HEP - performed trial reps PRN with mod cues for demonstration and comprehension   Manual STM with compression to Rt gastroc and soleus; skilled palpation and monitoring of soft tissue during DN Trigger Point Dry-Needling  Treatment instructions: Expect mild to moderate muscle soreness. S/S of pneumothorax if dry needled over a lung field, and to seek immediate medical attention should they occur. Patient verbalized understanding of these instructions and education.  Patient Consent Given: Yes Education handout provided: Yes Muscles treated: Rt soleus Electrical stimulation performed: No Parameters: N/A Treatment response/outcome: twitch responses    PATIENT EDUCATION:  Education details: HEP, DN Person educated: Patient Education method: Explanation, Demonstration, and Handouts Education comprehension: verbalized understanding, returned demonstration, and needs further education  HOME EXERCISE PROGRAM: Access Code: [email or web address] URL: [email or web address] Date: 06/19/2023 Prepared by: Moshe Cipro  Exercises - Seated Ankle Eversion with Resistance  - 1-2 x daily - 7 x weekly - 1 sets - 20 reps - Ankle and Toe Plantarflexion with Resistance  - 1-2 x daily - 7 x weekly - 1 sets - 20 reps - Standing Eccentric Heel Raise  - 1-2 x daily - 7 x weekly - 1-2 sets - 10 reps   ASSESSMENT:  CLINICAL IMPRESSION: Patient is a 62 y.o. female who  was seen today for physical therapy evaluation and treatment for Rt ankle and achilles pain. She demonstrates decreased strength, mild gait abnormalities and trigger points affecting functional mobility.  She also has a history of vertigo which seems to still be impacting function so will address this PRN as well.  She will benefit from PT to address deficits listed.    OBJECTIVE IMPAIRMENTS: Abnormal gait, decreased balance, decreased mobility, difficulty walking, decreased  strength, increased fascial restrictions, increased muscle spasms, and pain.   ACTIVITY LIMITATIONS: carrying, lifting, squatting, stairs, and locomotion level  PARTICIPATION LIMITATIONS: meal prep, cleaning, laundry, driving, shopping, and community activity  PERSONAL FACTORS: 1-2 comorbidities: HTN, HLD  are also affecting patient's functional outcome.   REHAB POTENTIAL: Good  CLINICAL DECISION MAKING: Evolving/moderate complexity  EVALUATION COMPLEXITY: Moderate   GOALS: Goals reviewed with patient? Yes  SHORT TERM GOALS: Target date: 07/10/2023  Independent with initial HEP Goal status: INITIAL   LONG TERM GOALS: Target date: 07/31/2023  Independent with final HEP Goal status: INITIAL  2.  FOTO score improved to 66 Goal status: INITIAL  3.  Rt ankle strength improved to 5/5 for improved function and mobility Goal status: INIITAL  4.  Report pain < 3/10 with stairs and walking inclines for improved function Goal status: INITIAL  5.  Perform bil SLS to at least 10 sec for improved stability and balance Goal status: INITIAL    PLAN:  PT FREQUENCY: 1x/week  PT DURATION: 6 weeks  PLANNED INTERVENTIONS: Therapeutic exercises, Therapeutic activity, Neuromuscular re-education, Balance training, Gait training, Patient/Family education, Self Care, Joint mobilization, Joint manipulation, Stair training, Vestibular training, Canalith repositioning, Aquatic Therapy, Dry Needling, Electrical stimulation, Cryotherapy, Moist heat, Taping, Vasopneumatic device, Ultrasound, Ionotophoresis 4mg /ml Dexamethasone, Manual therapy, and Re-evaluation.  PLAN FOR NEXT SESSION: review HEP, assess vertigo PRN, continue with eccentric strengthening, manual/modalities/DN PRN, assess vertigo PRN    Clarita Crane, PT, DPT 06/19/23 11:41 AM

## 2023-06-19 ENCOUNTER — Encounter: Payer: Self-pay | Admitting: Physical Therapy

## 2023-06-19 ENCOUNTER — Other Ambulatory Visit: Payer: Self-pay

## 2023-06-19 ENCOUNTER — Ambulatory Visit (INDEPENDENT_AMBULATORY_CARE_PROVIDER_SITE_OTHER): Payer: BC Managed Care – PPO | Admitting: Physical Therapy

## 2023-06-19 DIAGNOSIS — R42 Dizziness and giddiness: Secondary | ICD-10-CM

## 2023-06-19 DIAGNOSIS — M25571 Pain in right ankle and joints of right foot: Secondary | ICD-10-CM

## 2023-06-19 DIAGNOSIS — R29898 Other symptoms and signs involving the musculoskeletal system: Secondary | ICD-10-CM

## 2023-06-22 ENCOUNTER — Encounter: Payer: Self-pay | Admitting: Sports Medicine

## 2023-06-22 ENCOUNTER — Ambulatory Visit: Payer: BC Managed Care – PPO | Admitting: Sports Medicine

## 2023-06-22 DIAGNOSIS — M7731 Calcaneal spur, right foot: Secondary | ICD-10-CM

## 2023-06-22 DIAGNOSIS — M6788 Other specified disorders of synovium and tendon, other site: Secondary | ICD-10-CM

## 2023-06-22 DIAGNOSIS — M25571 Pain in right ankle and joints of right foot: Secondary | ICD-10-CM

## 2023-06-22 NOTE — Progress Notes (Signed)
Barbara Glenn - 62 y.o. female MRN [id number]  Date of birth: 09/05/1961  Office Visit Note: Visit Date: 06/22/2023 PCP: Pcp, No Referred by: No ref. provider found  Subjective: Chief Complaint  Patient presents with   Right Achilles Tendon - Follow-up    ECSWT   HPI: Barbara Glenn is a pleasant 62 y.o. female who presents today for follow-up of acute on chronic right heel and Achilles pain.   We have performed 3 sessions of extracorporeal shockwave therapy, she does feel like she is making good improvements with this, at this point feels like she is about 60% improved or more.  She feels good in the 3/16 inch heel lifts, does notice an improvement with this.  She is working to get new shoes with more support.  She did have her first session of physical therapy with Judeth Cornfield, tolerated dry needling with some soreness.  Pertinent ROS were reviewed with the patient and found to be negative unless otherwise specified above in HPI.   Assessment & Plan: Visit Diagnoses:  1. Achilles tendinosis of right lower extremity   2. Calcaneal spur of right foot   3. Pain in right ankle and joints of right foot    Plan: [name] has completed 3 sessions of extracorporeal shockwave therapy and is having positive improvements from this.  We did repeat ESWT today.  She will continue on her 3/16 inch heel lifts to offload the Achilles.  We discussed appropriate activity modification and shoewear.  She will continue meloxicam 15 mg once daily for now, we will likely plan to stop this when she is about 75% improved or at least reduced to as needed.  She will continue both her formal physical therapy and HEP.  She will come next week for repeat shockwave and further evaluation.  Follow-up: Return in about 1 week (around 06/29/2023) for for R-heel (reg visit).   Meds & Orders: No orders of the defined types were placed in this encounter.  No orders of the defined types were placed in this encounter.     Procedures: Procedure: ECSWT Indications:     Procedure Details Consent: Risks of procedure as well as the alternatives and risks of each were explained to the patient.  Verbal consent for procedure obtained. Time Out: Verified patient identification, verified procedure, site was marked, verified correct patient position. The area was cleaned with alcohol swab.     The right Achilles and superior calcaneus was targeted for Extracorporeal shockwave therapy.    Preset: Achillodynia Power Level: 90 mJ Frequency: 11 Hz Impulse/cycles: 2750 (250 at superior calcaneus) Head size: Regular   Patient tolerated procedure well without immediate complications.       Clinical History: No specialty comments available.  She reports that she has never smoked. She has never used smokeless tobacco. No results for input(s): "HGBA1C", "LABURIC" in the last 8760 hours.  Objective:   Vital Signs: There were no vitals taken for this visit.  Physical Exam  Gen: Well-appearing, in no acute distress; non-toxic CV: Well-perfused. Warm.  Resp: Breathing unlabored on room air; no wheezing. Psych: Fluid speech in conversation; appropriate affect; normal thought process Neuro: Sensation intact throughout. No gross coordination deficits.   Ortho Exam -Right foot/ankle: No significant TTP over the distal Achilles or the calcaneus.  Negative calcaneal heel squeeze.  There is full range of motion with plantarflexion and dorsiflexion.  Imaging: No results found.  Past Medical/Family/Surgical/Social History: Medications & Allergies reviewed per EMR, new medications updated.  There are no problems to display for this patient.  Past Medical History:  Diagnosis Date   Hyperlipidemia    Hypertension    No family history on file. Past Surgical History:  Procedure Laterality Date   DILATION AND CURETTAGE OF UTERUS     ENDOMETRIAL ABLATION     PLACEMENT OF BREAST IMPLANTS     Social History    Occupational History   Not on file  Tobacco Use   Smoking status: Never   Smokeless tobacco: Never  Substance and Sexual Activity   Alcohol use: Yes    Alcohol/week: 6.0 standard drinks of alcohol    Types: 6 Cans of beer per week   Drug use: Never   Sexual activity: Not on file

## 2023-06-26 ENCOUNTER — Encounter: Payer: Self-pay | Admitting: Physical Therapy

## 2023-06-26 ENCOUNTER — Ambulatory Visit (INDEPENDENT_AMBULATORY_CARE_PROVIDER_SITE_OTHER): Payer: BC Managed Care – PPO | Admitting: Physical Therapy

## 2023-06-26 DIAGNOSIS — R29898 Other symptoms and signs involving the musculoskeletal system: Secondary | ICD-10-CM | POA: Diagnosis not present

## 2023-06-26 DIAGNOSIS — M25571 Pain in right ankle and joints of right foot: Secondary | ICD-10-CM | POA: Diagnosis not present

## 2023-06-26 DIAGNOSIS — R42 Dizziness and giddiness: Secondary | ICD-10-CM

## 2023-06-26 DIAGNOSIS — J3089 Other allergic rhinitis: Secondary | ICD-10-CM | POA: Diagnosis not present

## 2023-06-26 NOTE — Therapy (Signed)
OUTPATIENT PHYSICAL THERAPY TREATMENT   Patient Name: Barbara Glenn MRN: [id number] DOB:[birth year], 62 y.o., female Today's Date: 06/26/2023  END OF SESSION:  PT End of Session - 06/26/23 1058     Visit Number 2    Number of Visits 6    Date for PT Re-Evaluation 07/31/23    Authorization Type BC[employer] 30% coinsurance; 30 visit limit    PT Start Time 1056    PT Stop Time 1130    PT Time Calculation (min) 34 min    Activity Tolerance Patient tolerated treatment well              Past Medical History:  Diagnosis Date   Hyperlipidemia    Hypertension    Past Surgical History:  Procedure Laterality Date   DILATION AND CURETTAGE OF UTERUS     ENDOMETRIAL ABLATION     PLACEMENT OF BREAST IMPLANTS     There are no problems to display for this patient.   PCP: Pcp, No  REFERRING PROVIDER: Madelyn Brunner, DO  REFERRING DIAG: [phone] (ICD-10-CM) - Achilles tendinosis of right lower extremity  Rationale for Evaluation and Treatment: Rehabilitation  THERAPY DIAG:  Pain in right ankle and joints of right foot  Other symptoms and signs involving the musculoskeletal system  Dizziness and giddiness  ONSET DATE: Sept 2023   SUBJECTIVE:                                                                                                                                                                                           SUBJECTIVE STATEMENT: Had more pain last night; but was in the car for 1.5 hours, sitting too long causes stiffness and discomfort    PERTINENT HISTORY:  HLD, HTN  PAIN:  Are you having pain? Yes: NPRS scale: 0 currently, 5-6/10 Pain location: Rt heel/achilles Pain description: stretching, pulling Aggravating factors: [place] worse in [place] Relieving factors: rest, shockwave therapy  PRECAUTIONS:  None  RED FLAGS: None   WEIGHT BEARING RESTRICTIONS:  No  FALLS:  Has patient fallen in last 6 months? 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 Moderate tendinosis of the Achilles  tendon without a tear and with subcortical bone marrow edema at the Achilles insertion. Mild retrocalcaneal bursitis. 2. Moderate tendinosis of the peroneus brevis with a small partial-thickness tear just distal to the lateral malleolus.  PATIENT SURVEYS:  06/19/23: FOTO 56 (predicted 66)  COGNITIVE STATUS: Within functional limits for tasks assessed   SENSATION: WFL  POSTURE:  rounded shoulders and forward head  GAIT: 06/19/23 Comments: mild antalgic gait   PALPATION: Trigger points noted in Rt soleus  LOWER EXTREMITY ROM:     ROM Right eval Left eval  Ankle dorsiflexion A: 5 P:10   Ankle plantarflexion A: 59   Ankle inversion A: 22   Ankle eversion A: 13    (Blank rows = not tested)   LOWER EXTREMITY MMT:    MMT Right eval Left eval  Ankle dorsiflexion 5/5   Ankle plantarflexion 4/5   Ankle inversion 5/5   Ankle eversion 4/5    (Blank rows = not tested)    FUNCTIONAL TESTS:  06/19/23:  SLS: RLE: 5 sec; LLE: 7 sec   TREATMENT:                                                                                                                              DATE:  06/26/23 TherEx Review of HEP   Self-Care Discussed option of LLLD stretch brace to wear as able on car rides as well as at home to see if that helps with pain after prolonged sitting - pt verbalized understanding  Manual STM with IASTM with compression to Rt gastroc and soleus; skilled palpation and  monitoring of soft tissue during DN Trigger Point Dry-Needling  Treatment instructions: Expect mild to moderate muscle soreness. S/S of pneumothorax if dry needled over a lung field, and to seek immediate medical attention should they occur. Patient verbalized understanding of these instructions and education.  Patient Consent Given: Yes Education handout provided: Yes Muscles treated: Rt soleus, Rt gastroc Electrical stimulation performed: No Parameters: N/A Treatment response/outcome: twitch responses   06/19/23 TherEx See HEP - performed trial reps PRN with mod cues for demonstration and comprehension   Manual STM with compression to Rt gastroc and soleus; skilled palpation and monitoring of soft tissue during DN Trigger Point Dry-Needling  Treatment instructions: Expect mild to moderate muscle soreness. S/S of pneumothorax if dry needled over a lung field, and to seek immediate medical attention should they occur. Patient verbalized understanding of these instructions and education.  Patient Consent Given: Yes Education handout provided: Yes Muscles treated: Rt soleus Electrical stimulation performed: No Parameters: N/A Treatment response/outcome: twitch responses    PATIENT EDUCATION:  Education details: HEP, DN Person educated: Patient Education method: Explanation, Demonstration, and Handouts Education comprehension: verbalized understanding, returned demonstration, and needs further education  HOME EXERCISE PROGRAM: Access Code: [email or web address] URL: [email or web address] Date: 06/19/2023 Prepared by: Moshe Cipro  Exercises - Seated Ankle Eversion with Resistance  - 1-2 x daily - 7 x weekly - 1  sets - 20 reps - Ankle and Toe Plantarflexion with Resistance  - 1-2 x daily - 7 x weekly - 1 sets - 20 reps - Standing Eccentric Heel Raise  - 1-2 x daily - 7 x weekly - 1-2 sets - 10 reps   ASSESSMENT:  CLINICAL IMPRESSION: Pt tolerated session well  today with overall reduction of pain, but still having increased pain after sitting for prolonged time (drives to [place] frequently).  Overall progressing well with PT and will continue to benefit from PT to maximize function.  OBJECTIVE IMPAIRMENTS: Abnormal gait, decreased balance, decreased mobility, difficulty walking, decreased strength, increased fascial restrictions, increased muscle spasms, and pain.   ACTIVITY LIMITATIONS: carrying, lifting, squatting, stairs, and locomotion level  PARTICIPATION LIMITATIONS: meal prep, cleaning, laundry, driving, shopping, and community activity  PERSONAL FACTORS: 1-2 comorbidities: HTN, HLD  are also affecting patient's functional outcome.   REHAB POTENTIAL: Good  CLINICAL DECISION MAKING: Evolving/moderate complexity  EVALUATION COMPLEXITY: Moderate   GOALS: Goals reviewed with patient? Yes  SHORT TERM GOALS: Target date: 07/10/2023  Independent with initial HEP Goal status: INITIAL   LONG TERM GOALS: Target date: 07/31/2023  Independent with final HEP Goal status: INITIAL  2.  FOTO score improved to 66 Goal status: INITIAL  3.  Rt ankle strength improved to 5/5 for improved function and mobility Goal status: INIITAL  4.  Report pain < 3/10 with stairs and walking inclines for improved function Goal status: INITIAL  5.  Perform bil SLS to at least 10 sec for improved stability and balance Goal status: INITIAL    PLAN:  PT FREQUENCY: 1x/week  PT DURATION: 6 weeks  PLANNED INTERVENTIONS: Therapeutic exercises, Therapeutic activity, Neuromuscular re-education, Balance training, Gait training, Patient/Family education, Self Care, Joint mobilization, Joint manipulation, Stair training, Vestibular training, Canalith repositioning, Aquatic Therapy, Dry Needling, Electrical stimulation, Cryotherapy, Moist heat, Taping, Vasopneumatic device, Ultrasound, Ionotophoresis 4mg /ml Dexamethasone, Manual therapy, and Re-evaluation.  PLAN  FOR NEXT SESSION: review HEP PRN, assess vertigo PRN, continue with eccentric strengthening, manual/modalities/DN PRN, assess vertigo PRN    Clarita Crane, PT, DPT 06/26/23 11:33 AM

## 2023-06-30 ENCOUNTER — Ambulatory Visit (INDEPENDENT_AMBULATORY_CARE_PROVIDER_SITE_OTHER): Payer: BC Managed Care – PPO | Admitting: Sports Medicine

## 2023-06-30 DIAGNOSIS — M7731 Calcaneal spur, right foot: Secondary | ICD-10-CM

## 2023-06-30 DIAGNOSIS — M6788 Other specified disorders of synovium and tendon, other site: Secondary | ICD-10-CM | POA: Diagnosis not present

## 2023-06-30 DIAGNOSIS — M25571 Pain in right ankle and joints of right foot: Secondary | ICD-10-CM | POA: Diagnosis not present

## 2023-06-30 NOTE — Progress Notes (Signed)
Barbara Glenn - 62 y.o. female MRN [id number]  Date of birth: Jul 24, 1961  Office Visit Note: Visit Date: 06/30/2023 PCP: Pcp, No Referred by: No ref. provider found  Subjective: Chief Complaint  Patient presents with   Foot Pain    Patient has been feeling better with shockwave treatment. She states that she only takes Meloxicam for inflammation, and has done physical therapy.    HPI: Barbara Glenn is a pleasant 62 y.o. female who presents today for follow-up of acute on chronic right heel and Achilles pain.    We have performed 4 sessions of extracorporeal shockwave therapy, she does feel like she is making good improvements with this, at this point feels like she is continuing to improve with each treatment. Did purchase a night splint/sock (sounds like [place] sock) at PT recommendation to wear at night/hours of day -- this caused some irritation.  Pertinent ROS were reviewed with the patient and found to be negative unless otherwise specified above in HPI.   Assessment & Plan: Visit Diagnoses:  1. Achilles tendinosis of right lower extremity   2. Calcaneal spur of right foot   3. Pain in right ankle and joints of right foot    Plan: Barbara Glenn is making improvements in her distal Achilles tendinopathy and calcaneal bone marrow edema - she has responded well to extracorporeal shockwave treatment, PT, and HEP. She does continue in her 3/16" heel lifts, but these are starting to wear down. We did proceed with additional ECSWT today, she tolerated well. Given her improvement she may discontinue her scheduled Meloxicam and only use PRN for more severe pain. We will plan to repeat 1-2 more sessions of ECSWT before taking 4-6 week holiday to check on cumulative benefit. She will continue PT and HEP until that time. Could consider replacing heel lifts until further improved at next visit. F/u in 1 week.  Follow-up: Return in about 1 week (around 07/07/2023) for R-heel .   Meds & Orders: No  orders of the defined types were placed in this encounter.  No orders of the defined types were placed in this encounter.    Procedures: Procedure: ECSWT Indications:  Achilles tendinopathy   Procedure Details Consent: Risks of procedure as well as the alternatives and risks of each were explained to the patient.  Verbal consent for procedure obtained. Time Out: Verified patient identification, verified procedure, site was marked, verified correct patient position. The area was cleaned with alcohol swab.     The right Achilles and superior calcaneus was targeted for Extracorporeal shockwave therapy.    Preset: Achillodynia Power Level: 90-100 mJ Frequency: 11 Hz Impulse/cycles: 2600 (300 at superior calcaneus) Head size: Regular   Patient tolerated procedure well without immediate complications.        Clinical History: No specialty comments available.  She reports that she has never smoked. She has never used smokeless tobacco. No results for input(s): "HGBA1C", "LABURIC" in the last 8760 hours.  Objective:   Vital Signs: There were no vitals taken for this visit.  Physical Exam  Gen: Well-appearing, in no acute distress; non-toxic CV: Well-perfused. Warm.  Resp: Breathing unlabored on room air; no wheezing. Psych: Fluid speech in conversation; appropriate affect; normal thought process Neuro: Sensation intact throughout. No gross coordination deficits.   Ortho Exam - Right foot/ankle: No significant TTP over the distal Achilles or the calcaneus. Negative calcaneal heel squeeze. There is full range of motion with plantarflexion and dorsiflexion.   Imaging: No results found.  Past Medical/Family/Surgical/Social History: Medications & Allergies reviewed per EMR, new medications updated. There are no problems to display for this patient.  Past Medical History:  Diagnosis Date   Hyperlipidemia    Hypertension    No family history on file. Past Surgical History:   Procedure Laterality Date   DILATION AND CURETTAGE OF UTERUS     ENDOMETRIAL ABLATION     PLACEMENT OF BREAST IMPLANTS     Social History   Occupational History   Not on file  Tobacco Use   Smoking status: Never   Smokeless tobacco: Never  Substance and Sexual Activity   Alcohol use: Yes    Alcohol/week: 6.0 standard drinks of alcohol    Types: 6 Cans of beer per week   Drug use: Never   Sexual activity: Not on file

## 2023-07-03 ENCOUNTER — Encounter: Payer: Self-pay | Admitting: Physical Therapy

## 2023-07-03 ENCOUNTER — Ambulatory Visit (INDEPENDENT_AMBULATORY_CARE_PROVIDER_SITE_OTHER): Payer: BC Managed Care – PPO | Admitting: Physical Therapy

## 2023-07-03 DIAGNOSIS — R29898 Other symptoms and signs involving the musculoskeletal system: Secondary | ICD-10-CM

## 2023-07-03 DIAGNOSIS — M25571 Pain in right ankle and joints of right foot: Secondary | ICD-10-CM

## 2023-07-03 DIAGNOSIS — J3089 Other allergic rhinitis: Secondary | ICD-10-CM | POA: Diagnosis not present

## 2023-07-03 NOTE — Therapy (Signed)
OUTPATIENT PHYSICAL THERAPY TREATMENT   Patient Name: Barbara Glenn MRN: [id number] DOB:[birth year], 62 y.o., female Today's Date: 07/03/2023  END OF SESSION:  PT End of Session - 07/03/23 0932     Visit Number 3    Number of Visits 6    Date for PT Re-Evaluation 07/31/23    Authorization Type [employer] 30% coinsurance; 30 visit limit    PT Start Time 0932    PT Stop Time 1005    PT Time Calculation (min) 33 min    Activity Tolerance Patient tolerated treatment well               Past Medical History:  Diagnosis Date   Hyperlipidemia    Hypertension    Past Surgical History:  Procedure Laterality Date   DILATION AND CURETTAGE OF UTERUS     ENDOMETRIAL ABLATION     PLACEMENT OF BREAST IMPLANTS     There are no problems to display for this patient.   PCP: Pcp, No  REFERRING PROVIDER: Madelyn Brunner, DO  REFERRING DIAG: [phone] (ICD-10-CM) - Achilles tendinosis of right lower extremity  Rationale for Evaluation and Treatment: Rehabilitation  THERAPY DIAG:  Pain in right ankle and joints of right foot  Other symptoms and signs involving the musculoskeletal system  ONSET DATE: Sept 2023   SUBJECTIVE:                                                                                                                                                                                           SUBJECTIVE STATEMENT: Has some better days and then some days that irritate the ankle    PERTINENT HISTORY:  HLD, HTN  PAIN:  Are you having pain? Yes: NPRS scale: 0 currently, 5-6/10 Pain location: Rt heel/achilles Pain description: stretching, pulling Aggravating factors: [place]; worse in [place] Relieving factors: rest, shockwave therapy  PRECAUTIONS:  None  RED FLAGS: None   WEIGHT BEARING RESTRICTIONS:  No  FALLS:  Has patient fallen in last 6 months? 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 Moderate tendinosis of the Achilles tendon without a tear and with subcortical bone marrow edema at the Achilles insertion. Mild retrocalcaneal  bursitis. 2. Moderate tendinosis of the peroneus brevis with a small partial-thickness tear just distal to the lateral malleolus.  PATIENT SURVEYS:  06/19/23: FOTO 56 (predicted 66)  COGNITIVE STATUS: Within functional limits for tasks assessed   SENSATION: WFL  POSTURE:  rounded shoulders and forward head  GAIT: 06/19/23 Comments: mild antalgic gait   PALPATION: Trigger points noted in Rt soleus  LOWER EXTREMITY ROM:     ROM Right eval Left eval  Ankle dorsiflexion A: 5 P:10   Ankle plantarflexion A: 59   Ankle inversion A: 22   Ankle eversion A: 13    (Blank rows = not tested)   LOWER EXTREMITY MMT:    MMT Right eval Left eval  Ankle dorsiflexion 5/5   Ankle plantarflexion 4/5   Ankle inversion 5/5   Ankle eversion 4/5    (Blank rows = not tested)    FUNCTIONAL TESTS:  06/19/23:  SLS: RLE: 5 sec; LLE: 7 sec   TREATMENT:                                                                                                                              DATE:  07/03/23 TherEx Calf raises with L3 band 2x10; discussed off step at home letting pain be her guide  Self-Care Adjustment and fitting of stretch brace - able to get good fit with light stretch for pt; discussed reassessing stretch every 10 min to see if it needs adjustment  Manual STM with IASTM with compression to Rt gastroc and soleus; skilled palpation and monitoring of soft tissue  during DN Trigger Point Dry-Needling  Treatment instructions: Expect mild to moderate muscle soreness. S/S of pneumothorax if dry needled over a lung field, and to seek immediate medical attention should they occur. Patient verbalized understanding of these instructions and education.  Patient Consent Given: Yes Education handout provided: Yes Muscles treated: Rt soleus, Rt gastroc Electrical stimulation performed: No Parameters: N/A Treatment response/outcome: twitch responses  06/26/23 TherEx Review of HEP   Self-Care Discussed option of LLLD stretch brace to wear as able on car rides as well as at home to see if that helps with pain after prolonged sitting - pt verbalized understanding  Manual STM with IASTM with compression to Rt gastroc and soleus; skilled palpation and monitoring of soft tissue during DN Trigger Point Dry-Needling  Treatment instructions: Expect mild to moderate muscle soreness. S/S of pneumothorax if dry needled over a lung field, and to seek immediate medical attention should they occur. Patient verbalized understanding of these instructions and education.  Patient Consent Given: Yes Education handout provided: Yes Muscles treated: Rt soleus, Rt gastroc Electrical stimulation performed: No Parameters: N/A Treatment response/outcome: twitch responses   06/19/23 TherEx See HEP - performed trial reps PRN with mod cues for demonstration and comprehension   Manual STM with compression to Rt gastroc and soleus; skilled palpation and monitoring of soft tissue during DN Trigger Point Dry-Needling  Treatment instructions:  Expect mild to moderate muscle soreness. S/S of pneumothorax if dry needled over a lung field, and to seek immediate medical attention should they occur. Patient verbalized understanding of these instructions and education.  Patient Consent Given: Yes Education handout provided: Yes Muscles treated: Rt soleus Electrical stimulation performed:  No Parameters: N/A Treatment response/outcome: twitch responses    PATIENT EDUCATION:  Education details: HEP, DN Person educated: Patient Education method: Explanation, Demonstration, and Handouts Education comprehension: verbalized understanding, returned demonstration, and needs further education  HOME EXERCISE PROGRAM: Access Code: [email or web address] URL: [email or web address] Date: 07/03/2023 Prepared by: Moshe Cipro  Exercises - Seated Ankle Eversion with Resistance  - 1-2 x daily - 7 x weekly - 1 sets - 20 reps - Ankle and Toe Plantarflexion with Resistance  - 1-2 x daily - 7 x weekly - 1 sets - 20 reps - Standing Eccentric Heel Raise  - 1-2 x daily - 7 x weekly - 1-2 sets - 10 reps - Standing Heel Raise with Band  - 1-2 x daily - 7 x weekly - 1-2 sets - 10 reps   ASSESSMENT:  CLINICAL IMPRESSION: Pt tolerated session well today reporting improved pain after session.  Overall progressing well with PT and will continue to benefit from PT to maximize function.  OBJECTIVE IMPAIRMENTS: Abnormal gait, decreased balance, decreased mobility, difficulty walking, decreased strength, increased fascial restrictions, increased muscle spasms, and pain.   ACTIVITY LIMITATIONS: carrying, lifting, squatting, stairs, and locomotion level  PARTICIPATION LIMITATIONS: meal prep, cleaning, laundry, driving, shopping, and community activity  PERSONAL FACTORS: 1-2 comorbidities: HTN, HLD  are also affecting patient's functional outcome.   REHAB POTENTIAL: Good  CLINICAL DECISION MAKING: Evolving/moderate complexity  EVALUATION COMPLEXITY: Moderate   GOALS: Goals reviewed with patient? Yes  SHORT TERM GOALS: Target date: 07/10/2023  Independent with initial HEP Goal status: INITIAL   LONG TERM GOALS: Target date: 07/31/2023  Independent with final HEP Goal status: INITIAL  2.  FOTO score improved to 66 Goal status: INITIAL  3.  Rt ankle strength improved to 5/5  for improved function and mobility Goal status: INIITAL  4.  Report pain < 3/10 with stairs and walking inclines for improved function Goal status: INITIAL  5.  Perform bil SLS to at least 10 sec for improved stability and balance Goal status: INITIAL    PLAN:  PT FREQUENCY: 1x/week  PT DURATION: 6 weeks  PLANNED INTERVENTIONS: Therapeutic exercises, Therapeutic activity, Neuromuscular re-education, Balance training, Gait training, Patient/Family education, Self Care, Joint mobilization, Joint manipulation, Stair training, Vestibular training, Canalith repositioning, Aquatic Therapy, Dry Needling, Electrical stimulation, Cryotherapy, Moist heat, Taping, Vasopneumatic device, Ultrasound, Ionotophoresis 4mg /ml Dexamethasone, Manual therapy, and Re-evaluation.  PLAN FOR NEXT SESSION:, assess vertigo PRN, continue with eccentric strengthening, manual/modalities/DN PRN, assess vertigo PRN   NEXT MD VISIT: 07/07/23  Clarita Crane, PT, DPT 07/03/23 10:07 AM

## 2023-07-07 ENCOUNTER — Ambulatory Visit (INDEPENDENT_AMBULATORY_CARE_PROVIDER_SITE_OTHER): Payer: BC Managed Care – PPO | Admitting: Physical Therapy

## 2023-07-07 ENCOUNTER — Ambulatory Visit (INDEPENDENT_AMBULATORY_CARE_PROVIDER_SITE_OTHER): Payer: BC Managed Care – PPO | Admitting: Sports Medicine

## 2023-07-07 ENCOUNTER — Encounter: Payer: Self-pay | Admitting: Physical Therapy

## 2023-07-07 DIAGNOSIS — M25571 Pain in right ankle and joints of right foot: Secondary | ICD-10-CM

## 2023-07-07 DIAGNOSIS — M6788 Other specified disorders of synovium and tendon, other site: Secondary | ICD-10-CM | POA: Diagnosis not present

## 2023-07-07 DIAGNOSIS — R42 Dizziness and giddiness: Secondary | ICD-10-CM

## 2023-07-07 DIAGNOSIS — R29898 Other symptoms and signs involving the musculoskeletal system: Secondary | ICD-10-CM | POA: Diagnosis not present

## 2023-07-07 DIAGNOSIS — M7731 Calcaneal spur, right foot: Secondary | ICD-10-CM | POA: Diagnosis not present

## 2023-07-07 NOTE — Therapy (Signed)
OUTPATIENT PHYSICAL THERAPY TREATMENT   Patient Name: Barbara Glenn MRN: [id number] DOB:[birth year], 62 y.o., female Today's Date: 07/07/2023  END OF SESSION:  PT End of Session - 07/07/23 1012     Visit Number 4    Number of Visits 6    Date for PT Re-Evaluation 07/31/23    Authorization Type [employer] 30% coinsurance; 30 visit limit    PT Start Time 1012    PT Stop Time 1046    PT Time Calculation (min) 34 min    Activity Tolerance Patient tolerated treatment well                Past Medical History:  Diagnosis Date   Hyperlipidemia    Hypertension    Past Surgical History:  Procedure Laterality Date   DILATION AND CURETTAGE OF UTERUS     ENDOMETRIAL ABLATION     PLACEMENT OF BREAST IMPLANTS     There are no problems to display for this patient.   PCP: Pcp, No  REFERRING PROVIDER: Madelyn Brunner, DO  REFERRING DIAG: [id number] (ICD-10-CM) - Achilles tendinosis of right lower extremity  Rationale for Evaluation and Treatment: Rehabilitation  THERAPY DIAG:  Pain in right ankle and joints of right foot  Other symptoms and signs involving the musculoskeletal system  Dizziness and giddiness  ONSET DATE: Sept 2023   SUBJECTIVE:                                                                                                                                                                                           SUBJECTIVE STATEMENT: New shoes are causing some burning on the heel. May try to return them. Wearing DF splint 30 min a day.   PERTINENT HISTORY:  HLD, HTN  PAIN:  Are you having pain? Yes: NPRS scale: 0 currently, 5/10 Pain location: Rt heel/achilles Pain description: stretching, pulling Aggravating factors: [place] worse in [place] Relieving factors: rest, shockwave therapy  PRECAUTIONS:  None  RED FLAGS: None   WEIGHT BEARING RESTRICTIONS:  No  FALLS:  Has patient fallen in last 6 months? 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 Moderate tendinosis of the Achilles tendon without a  tear and with subcortical bone marrow edema at the Achilles insertion. Mild retrocalcaneal bursitis. 2. Moderate tendinosis of the peroneus brevis with a small partial-thickness tear just distal to the lateral malleolus.  PATIENT SURVEYS:  06/19/23: FOTO 56 (predicted 66) 07/07/23: FOTO 55  COGNITIVE STATUS: Within functional limits for tasks assessed   SENSATION: WFL  POSTURE:  rounded shoulders and forward head  GAIT: 06/19/23 Comments: mild antalgic gait   PALPATION: Trigger points noted in Rt soleus  LOWER EXTREMITY ROM:     ROM Right eval Left eval  Ankle dorsiflexion A: 5 P:10   Ankle plantarflexion A: 59   Ankle inversion A: 22   Ankle eversion A: 13    (Blank rows = not tested)   LOWER EXTREMITY MMT:    MMT Right eval Left eval  Ankle dorsiflexion 5/5   Ankle plantarflexion 4/5   Ankle inversion 5/5   Ankle eversion 4/5    (Blank rows = not tested)    FUNCTIONAL TESTS:  06/19/23:  SLS: RLE: 5 sec; LLE: 7 sec   TREATMENT:                                                                                                                              DATE:  07/06/23 Self-Care Discussed shoes and recommendation to consider returning shoes and trial of different pair that doesn't put her foot into too much pronation.  Also discussed seeing how foot feels after being in the brace to see if that's helping   Manual STM with IASTM with compression to Rt gastroc and  soleus; skilled palpation and monitoring of soft tissue during DN Trigger Point Dry-Needling  Treatment instructions: Expect mild to moderate muscle soreness. S/S of pneumothorax if dry needled over a lung field, and to seek immediate medical attention should they occur. Patient verbalized understanding of these instructions and education.  Patient Consent Given: Yes Education handout provided: Yes Muscles treated: Rt soleus, Rt gastroc Electrical stimulation performed: Yes Parameters: 8 mA with intensity to tolerance x 5 min Treatment response/outcome: twitch responses with reduced pain after session   07/03/23 TherEx Calf raises with L3 band 2x10; discussed off step at home letting pain be her guide  Self-Care Adjustment and fitting of stretch brace - able to get good fit with light stretch for pt; discussed reassessing stretch every 10 min to see if it needs adjustment  Manual STM with IASTM with compression to Rt gastroc and soleus; skilled palpation and monitoring of soft tissue during DN Trigger Point Dry-Needling  Treatment instructions: Expect mild to moderate muscle soreness. S/S of pneumothorax if dry needled over a lung field, and to seek immediate medical attention should they occur. Patient verbalized understanding of these instructions and education.  Patient Consent Given: Yes Education handout provided: Yes Muscles treated: Rt soleus, Rt gastroc Electrical stimulation performed: No Parameters: N/A Treatment response/outcome: twitch responses  06/26/23 TherEx Review of HEP   Self-Care Discussed option of  LLLD stretch brace to wear as able on car rides as well as at home to see if that helps with pain after prolonged sitting - pt verbalized understanding  Manual STM with IASTM with compression to Rt gastroc and soleus; skilled palpation and monitoring of soft tissue during DN Trigger Point Dry-Needling  Treatment instructions: Expect mild to moderate muscle  soreness. S/S of pneumothorax if dry needled over a lung field, and to seek immediate medical attention should they occur. Patient verbalized understanding of these instructions and education.  Patient Consent Given: Yes Education handout provided: Yes Muscles treated: Rt soleus, Rt gastroc Electrical stimulation performed: No Parameters: N/A Treatment response/outcome: twitch responses   06/19/23 TherEx See HEP - performed trial reps PRN with mod cues for demonstration and comprehension   Manual STM with compression to Rt gastroc and soleus; skilled palpation and monitoring of soft tissue during DN Trigger Point Dry-Needling  Treatment instructions: Expect mild to moderate muscle soreness. S/S of pneumothorax if dry needled over a lung field, and to seek immediate medical attention should they occur. Patient verbalized understanding of these instructions and education.  Patient Consent Given: Yes Education handout provided: Yes Muscles treated: Rt soleus Electrical stimulation performed: No Parameters: N/A Treatment response/outcome: twitch responses    PATIENT EDUCATION:  Education details: HEP, DN Person educated: Patient Education method: Explanation, Demonstration, and Handouts Education comprehension: verbalized understanding, returned demonstration, and needs further education  HOME EXERCISE PROGRAM: Access Code: [email or web address] URL: [email or web address] Date: 07/03/2023 Prepared by: Moshe Cipro  Exercises - Seated Ankle Eversion with Resistance  - 1-2 x daily - 7 x weekly - 1 sets - 20 reps - Ankle and Toe Plantarflexion with Resistance  - 1-2 x daily - 7 x weekly - 1 sets - 20 reps - Standing Eccentric Heel Raise  - 1-2 x daily - 7 x weekly - 1-2 sets - 10 reps - Standing Heel Raise with Band  - 1-2 x daily - 7 x weekly - 1-2 sets - 10 reps   ASSESSMENT:  CLINICAL IMPRESSION: Pt without pain after session today, and reports overall good  improvement in PT.  FOTO score decreased 1 point, but pt self reporting improvement overall.  Will continue to benefit from PT to maximize function.  OBJECTIVE IMPAIRMENTS: Abnormal gait, decreased balance, decreased mobility, difficulty walking, decreased strength, increased fascial restrictions, increased muscle spasms, and pain.   ACTIVITY LIMITATIONS: carrying, lifting, squatting, stairs, and locomotion level  PARTICIPATION LIMITATIONS: meal prep, cleaning, laundry, driving, shopping, and community activity  PERSONAL FACTORS: 1-2 comorbidities: HTN, HLD  are also affecting patient's functional outcome.   REHAB POTENTIAL: Good  CLINICAL DECISION MAKING: Evolving/moderate complexity  EVALUATION COMPLEXITY: Moderate   GOALS: Goals reviewed with patient? Yes  SHORT TERM GOALS: Target date: 07/10/2023  Independent with initial HEP Goal status: MET 07/07/23   LONG TERM GOALS: Target date: 07/31/2023  Independent with final HEP Goal status: INITIAL  2.  FOTO score improved to 66 Goal status: INITIAL  3.  Rt ankle strength improved to 5/5 for improved function and mobility Goal status: INIITAL  4.  Report pain < 3/10 with stairs and walking inclines for improved function Goal status: INITIAL  5.  Perform bil SLS to at least 10 sec for improved stability and balance Goal status: INITIAL    PLAN:  PT FREQUENCY: 1x/week  PT DURATION: 6 weeks  PLANNED INTERVENTIONS: Therapeutic exercises, Therapeutic activity, Neuromuscular re-education, Balance training, Gait training, Patient/Family education, Self Care, Joint mobilization, Joint manipulation, Stair  training, Vestibular training, Canalith repositioning, Aquatic Therapy, Dry Needling, Electrical stimulation, Cryotherapy, Moist heat, Taping, Vasopneumatic device, Ultrasound, Ionotophoresis 4mg /ml Dexamethasone, Manual therapy, and Re-evaluation.  PLAN FOR NEXT SESSION:, assess vertigo PRN, continue with eccentric  strengthening, manual/modalities/DN PRN, how was estim with DN   NEXT MD VISIT: 07/07/23  Clarita Crane, PT, DPT 07/07/23 10:46 AM

## 2023-07-07 NOTE — Progress Notes (Signed)
Barbara Glenn - 62 y.o. female MRN [id number]  Date of birth: [birth year]  Office Visit Note: Visit Date: 07/07/2023 PCP: Pcp, No Referred by: No ref. provider found  Subjective: Chief Complaint  Patient presents with   Right Foot - Follow-up, Pain   HPI: Barbara Glenn is a pleasant 62 y.o. female who presents today for follow-up of chronic right heel pain and Achilles insertional pain.  We have performed 5 sessions of extracorporeal shockwave therapy, she is also doing formalized physical therapy and HEP.  She is making improvements, although is not completely improved.  Feels like she is about at least 60-70% better at this time.  She is wearing a night splint/sock that she just started this past week at the recommendation of her PT, Barbara Glenn.  Meloxicam 2-3 times weekly as needed.  Pertinent ROS were reviewed with the patient and found to be negative unless otherwise specified above in HPI.   Assessment & Plan: Visit Diagnoses:  1. Achilles tendinosis of right lower extremity   2. Calcaneal spur of right foot   3. Pain in right ankle and joints of right foot    Plan: Barbara Glenn has been making improvements in her distal Achilles tendinopathy as well as her insertional calcaneal bone marrow edema.  We did repeat extracorporeal shockwave treatment today, tolerated well.  She will continue on her 3/16 inch heel lift to help offload the Achilles.  She will continue her formalized physical therapy as well as her HEP.  I would like to follow-up with her next week.  If she feels like she is making improvements, we will continue with the above intake about a 4 to 6-week holiday from shockwave therapy.  If she has plateaued in terms of her improvement, we will consider a short-term cam walker boot for 10-14 days and attempt to settle down the Achilles anterior calcaneal bone marrow edema.  She will continue PT and HEP until that time.  She does seem to be having some perineal irritation today, I think  this is purely from her shoewear modification as she tried on new shoes just this week.  She will follow-up in 1 week.  Follow-up: Return in about 1 week (around 07/14/2023) for Tues AM for R-heel pain.  Meds & Orders: No orders of the defined types were placed in this encounter.  No orders of the defined types were placed in this encounter.    Procedures: Procedure: ECSWT Indications:  Achilles tendinopathy   Procedure Details Consent: Risks of procedure as well as the alternatives and risks of each were explained to the patient.  Verbal consent for procedure obtained. Time Out: Verified patient identification, verified procedure, site was marked, verified correct patient position. The area was cleaned with alcohol swab.     The right Achilles and superior calcaneus was targeted for Extracorporeal shockwave therapy.    Preset: Achillodynia Power Level: 90 mJ Frequency: 11 Hz Impulse/cycles: 2700 (300 at superior calcaneus) Head size: Regular   Patient tolerated procedure well without immediate complications.        Clinical History: No specialty comments available.  She reports that she has never smoked. She has never used smokeless tobacco. No results for input(s): "HGBA1C", "LABURIC" in the last 8760 hours.  Objective:    Physical Exam  Gen: Well-appearing, in no acute distress; non-toxic CV:  Well-perfused. Warm.  Resp: Breathing unlabored on room air; no wheezing. Psych: Fluid speech in conversation; appropriate affect; normal thought process Neuro: Sensation intact throughout. No  gross coordination deficits.   Ortho Exam - Right foot/ankle: There is mild TTP with deep palpation over the superior calcaneus, no true Achilles TTP.  There is relative tenderness with testing of the peroneals with resisted inversion/eversion.  Heel squeeze.  Imaging:  Narrative & Impression  CLINICAL DATA:  Right ankle pain   EXAM: MRI OF THE RIGHT ANKLE WITHOUT CONTRAST    TECHNIQUE: Multiplanar, multisequence MR imaging of the ankle was performed. No intravenous contrast was administered.   COMPARISON:  None Available.   FINDINGS: TENDONS   Peroneal: Peroneal longus tendon intact. Moderate tendinosis of the peroneus brevis with a small partial-thickness tear just distal to the lateral malleolus.   Posteromedial: Posterior tibial tendon intact. Flexor hallucis longus tendon intact. Flexor digitorum longus tendon intact.   Anterior: Tibialis anterior tendon intact. Extensor hallucis longus tendon intact Extensor digitorum longus tendon intact.   Achilles: Moderate tendinosis of the Achilles tendon without a tear. Subcortical bone marrow edema at the Achilles insertion. Small amount of fluid in the retrocalcaneal bursa.   Plantar Fascia: Intact. Small plantar calcaneal spur.   LIGAMENTS   Lateral: Anterior talofibular ligament intact. Calcaneofibular ligament intact. Posterior talofibular ligament intact. Anterior and posterior tibiofibular ligaments intact.   Medial: Deltoid ligament intact. Spring ligament intact.   CARTILAGE   Ankle Joint: No joint effusion. Normal ankle mortise. No focal chondral defect. Mild subchondral reactive marrow changes focally in the medial corner of the talar dome.   Subtalar Joints/Sinus Tarsi: Normal subtalar joints. No subtalar joint effusion. Normal sinus tarsi.   Bones: No aggressive osseous lesion. No fracture or dislocation.   Soft Tissue: No fluid collection or hematoma. Muscles are normal without edema or atrophy. Tarsal tunnel is normal.   IMPRESSION: 1. Moderate tendinosis of the Achilles tendon without a tear and with subcortical bone marrow edema at the Achilles insertion. Mild retrocalcaneal bursitis. 2. Moderate tendinosis of the peroneus brevis with a small partial-thickness tear just distal to the lateral malleolus.     Electronically Signed   By: Elige Ko M.D.   On: 05/24/2023  13:17    Past Medical/Family/Surgical/Social History: Medications & Allergies reviewed per EMR, new medications updated. There are no problems to display for this patient.  Past Medical History:  Diagnosis Date   Hyperlipidemia    Hypertension    No family history on file. Past Surgical History:  Procedure Laterality Date   DILATION AND CURETTAGE OF UTERUS     ENDOMETRIAL ABLATION     PLACEMENT OF BREAST IMPLANTS     Social History   Occupational History   Not on file  Tobacco Use   Smoking status: Never   Smokeless tobacco: Never  Substance and Sexual Activity   Alcohol use: Yes    Alcohol/week: 6.0 standard drinks of alcohol    Types: 6 Cans of beer per week   Drug use: Never   Sexual activity: Not on file

## 2023-07-07 NOTE — Progress Notes (Signed)
Patient says that she believes that treatment is helping and that she feels okay today. She says that she had physical therapy this morning and dry needling in the achilles which seems to be helping

## 2023-07-09 DIAGNOSIS — J3081 Allergic rhinitis due to animal (cat) (dog) hair and dander: Secondary | ICD-10-CM | POA: Diagnosis not present

## 2023-07-09 DIAGNOSIS — J301 Allergic rhinitis due to pollen: Secondary | ICD-10-CM | POA: Diagnosis not present

## 2023-07-09 DIAGNOSIS — J3089 Other allergic rhinitis: Secondary | ICD-10-CM | POA: Diagnosis not present

## 2023-07-11 ENCOUNTER — Other Ambulatory Visit: Payer: Self-pay | Admitting: Sports Medicine

## 2023-07-14 ENCOUNTER — Encounter: Payer: Self-pay | Admitting: Sports Medicine

## 2023-07-14 ENCOUNTER — Ambulatory Visit (INDEPENDENT_AMBULATORY_CARE_PROVIDER_SITE_OTHER): Payer: BC Managed Care – PPO | Admitting: Sports Medicine

## 2023-07-14 DIAGNOSIS — M25571 Pain in right ankle and joints of right foot: Secondary | ICD-10-CM | POA: Diagnosis not present

## 2023-07-14 DIAGNOSIS — M6788 Other specified disorders of synovium and tendon, other site: Secondary | ICD-10-CM

## 2023-07-14 DIAGNOSIS — R937 Abnormal findings on diagnostic imaging of other parts of musculoskeletal system: Secondary | ICD-10-CM

## 2023-07-14 DIAGNOSIS — M7731 Calcaneal spur, right foot: Secondary | ICD-10-CM | POA: Diagnosis not present

## 2023-07-14 MED ORDER — MELOXICAM 15 MG PO TABS: ORAL_TABLET | ORAL | 0 refills | Status: DC

## 2023-07-14 NOTE — Progress Notes (Signed)
Patient is feeling frustrated, she says that she feels like she is at a standstill. She took Meloxicam once and needs a refill.

## 2023-07-14 NOTE — Progress Notes (Signed)
Barbara Glenn - 62 y.o. female MRN [id number]  Date of birth: July 24, 1961  Office Visit Note: Visit Date: 07/14/2023 PCP: Pcp, No Referred by: No ref. provider found  Subjective: Chief Complaint  Patient presents with   Right Heel - Follow-up, Pain   HPI: Barbara Glenn is a pleasant 62 y.o. female who presents today for follow-up of chronic right heel pain and Achilles insertional pain.   We have performed 6 sessions of extracorporeal shockwave therapy, she is also doing formalized physical therapy and HEP.  She had been making improvements, although recently feels like she is at a standstill.  She had company over and was doing a lot of standing, work around the house with cooking and feels like this exacerbated things.  Took meloxicam 1 time but is now out of it.  Feeling a burning sensation on the posterior aspect of the heel.  Pain with going up onto the toes.  She is still doing formalized physical therapy.  Pertinent ROS were reviewed with the patient and found to be negative unless otherwise specified above in HPI.   Assessment & Plan: Visit Diagnoses:  1. Achilles tendinosis of right lower extremity   2. Calcaneal spur of right foot   3. Pain in right ankle and joints of right foot   4. Bone marrow edema    Plan: Discussed treatment options with [name] as unfortunately she has had somewhat of a plateau with recent exacerbation of her posterior heel and insertional Achilles pain given her increased activity.  She certainly has made improvements with extracorporeal shockwave therapy but is having difficulty getting over the hump.  Given this, I think we will place her in a cam walker boot for a short shutdown to help offload the tendon as well as improve her bone marrow edema of the calcaneus.  She has a trip to [place], she is okay to go on that trip and then start the cam walker boot and her meloxicam 15 mg once daily if Sunday/Monday she returns.  We will then see her back between  10-12 days from this to reevaluate.  Consider additional shockwave with intermittent walker boot but would not like to keep her in this consistently.  Follow-up: Return in about 17 days (around 07/31/2023) for f/u for R-achilles/heel.   Meds & Orders:  Meds ordered this encounter  Medications   meloxicam (MOBIC) 15 MG tablet    Sig: TAKE 1 TABLET BY MOUTH EVERY DAY AS NEEDED    Dispense:  30 tablet    Refill:  0   No orders of the defined types were placed in this encounter.    Procedures: Procedure: ECSWT Indications:  Achilles tendinopathy   Procedure Details Consent: Risks of procedure as well as the alternatives and risks of each were explained to the patient.  Verbal consent for procedure obtained. Time Out: Verified patient identification, verified procedure, site was marked, verified correct patient position. The area was cleaned with alcohol swab.     The right Achilles and superior calcaneus was targeted for Extracorporeal shockwave therapy.    Preset: Achillodynia Power Level: 90 mJ Frequency: 11 Hz Impulse/cycles: 2700 (300 at superior calcaneus) Head size: Regular   Patient tolerated procedure well without immediate complications.         Clinical History: No specialty comments available.  She reports that she has never smoked. She has never used smokeless tobacco. No results for input(s): "HGBA1C", "LABURIC" in the last 8760 hours.  Objective:  Vital Signs: There were no vitals taken for this visit.  Physical Exam  Gen: Well-appearing, in no acute distress; non-toxic CV:  Well-perfused. Warm.  Resp: Breathing unlabored on room air; no wheezing. Psych: Fluid speech in conversation; appropriate affect; normal thought process Neuro: Sensation intact throughout. No gross coordination deficits.   Ortho Exam - Right ankle/heel: + TTP with deep palpation over the superior aspect of the calcaneus and the distal Achilles.  There is pain with heel raise.  Negative  heel squeeze.  No redness swelling or effusion of the ankle.  Imaging: No results found.  Past Medical/Family/Surgical/Social History: Medications & Allergies reviewed per EMR, new medications updated. There are no problems to display for this patient.  Past Medical History:  Diagnosis Date   Hyperlipidemia    Hypertension    History reviewed. No pertinent family history. Past Surgical History:  Procedure Laterality Date   DILATION AND CURETTAGE OF UTERUS     ENDOMETRIAL ABLATION     PLACEMENT OF BREAST IMPLANTS     Social History   Occupational History   Not on file  Tobacco Use   Smoking status: Never   Smokeless tobacco: Never  Substance and Sexual Activity   Alcohol use: Yes    Alcohol/week: 6.0 standard drinks of alcohol    Types: 6 Cans of beer per week   Drug use: Never   Sexual activity: Not on file

## 2023-07-16 ENCOUNTER — Ambulatory Visit (INDEPENDENT_AMBULATORY_CARE_PROVIDER_SITE_OTHER): Payer: BC Managed Care – PPO | Admitting: Physical Therapy

## 2023-07-16 ENCOUNTER — Encounter: Payer: Self-pay | Admitting: Physical Therapy

## 2023-07-16 DIAGNOSIS — J301 Allergic rhinitis due to pollen: Secondary | ICD-10-CM | POA: Diagnosis not present

## 2023-07-16 DIAGNOSIS — M25571 Pain in right ankle and joints of right foot: Secondary | ICD-10-CM | POA: Diagnosis not present

## 2023-07-16 DIAGNOSIS — R29898 Other symptoms and signs involving the musculoskeletal system: Secondary | ICD-10-CM

## 2023-07-16 DIAGNOSIS — J3089 Other allergic rhinitis: Secondary | ICD-10-CM | POA: Diagnosis not present

## 2023-07-16 DIAGNOSIS — J3081 Allergic rhinitis due to animal (cat) (dog) hair and dander: Secondary | ICD-10-CM | POA: Diagnosis not present

## 2023-07-16 NOTE — Therapy (Signed)
OUTPATIENT PHYSICAL THERAPY TREATMENT   Patient Name: LEVANA AGREDA MRN: [id number] DOB:June 30, 1961, 62 y.o., female Today's Date: 07/16/2023  END OF SESSION:  PT End of Session - 07/16/23 1037     Visit Number 5    Number of Visits 6    Date for PT Re-Evaluation 07/31/23    Authorization Type [employer] 30% coinsurance; 30 visit limit    PT Start Time 1035    PT Stop Time 1107    PT Time Calculation (min) 32 min    Activity Tolerance Patient tolerated treatment well                 Past Medical History:  Diagnosis Date   Hyperlipidemia    Hypertension    Past Surgical History:  Procedure Laterality Date   DILATION AND CURETTAGE OF UTERUS     ENDOMETRIAL ABLATION     PLACEMENT OF BREAST IMPLANTS     There are no problems to display for this patient.   PCP: Pcp, No  REFERRING PROVIDER: Madelyn Brunner, DO  REFERRING DIAG: [phone] (ICD-10-CM) - Achilles tendinosis of right lower extremity  Rationale for Evaluation and Treatment: Rehabilitation  THERAPY DIAG:  Pain in right ankle and joints of right foot  Other symptoms and signs involving the musculoskeletal system  ONSET DATE: Sept 2023   SUBJECTIVE:                                                                                                                                                                                           SUBJECTIVE STATEMENT: Has to start wearing the boot when she gets back for about a week and a half.  DF brace is helpful initially but then pain returns.    PERTINENT HISTORY:  HLD, HTN  PAIN:  Are you having pain? Yes: NPRS scale: 0 currently, 5/10 Pain location: Rt heel/achilles Pain description: stretching, pulling Aggravating factors: [place] worse in [place] Relieving factors: rest, shockwave therapy  PRECAUTIONS:  None  RED FLAGS: None   WEIGHT BEARING RESTRICTIONS:  No  FALLS:  Has patient fallen in last 6 months? 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 Moderate tendinosis of the  Achilles tendon without a tear and with subcortical bone marrow edema at the Achilles insertion. Mild retrocalcaneal bursitis. 2. Moderate tendinosis of the peroneus brevis with a small partial-thickness tear just distal to the lateral malleolus.  PATIENT SURVEYS:  06/19/23: FOTO 56 (predicted 66) 07/07/23: FOTO 55  COGNITIVE STATUS: Within functional limits for tasks assessed   SENSATION: WFL  POSTURE:  rounded shoulders and forward head  GAIT: 06/19/23 Comments: mild antalgic gait   PALPATION: Trigger points noted in Rt soleus  LOWER EXTREMITY ROM:     ROM Right eval Left eval  Ankle dorsiflexion A: 5 P:10   Ankle plantarflexion A: 59   Ankle inversion A: 22   Ankle eversion A: 13    (Blank rows = not tested)   LOWER EXTREMITY MMT:    MMT Right eval Left eval  Ankle dorsiflexion 5/5   Ankle plantarflexion 4/5   Ankle inversion 5/5   Ankle eversion 4/5    (Blank rows = not tested)    FUNCTIONAL TESTS:  06/19/23:  SLS: RLE: 5 sec; LLE: 7 sec   TREATMENT:                                                                                                                              DATE:  07/16/23 Manual STM with IASTM with compression to Rt gastroc, soleus and fibularis; skilled palpation and monitoring of soft tissue during DN Trigger Point Dry-Needling  Treatment instructions: Expect mild to moderate muscle soreness. S/S of pneumothorax if dry needled over a lung field, and to seek immediate  medical attention should they occur. Patient verbalized understanding of these instructions and education.  Patient Consent Given: Yes Education handout provided: Yes Muscles treated: Rt soleus, Rt gastroc, Rt fibularis Electrical stimulation performed: Yes Parameters: 8 mA with intensity to tolerance x 5 min to gastroc Treatment response/outcome: twitch responses with reduced pain after session  07/06/23 Self-Care Discussed shoes and recommendation to consider returning shoes and trial of different pair that doesn't put her foot into too much pronation.  Also discussed seeing how foot feels after being in the brace to see if that's helping   Manual STM with IASTM with compression to Rt gastroc and soleus; skilled palpation and monitoring of soft tissue during DN Trigger Point Dry-Needling  Treatment instructions: Expect mild to moderate muscle soreness. S/S of pneumothorax if dry needled over a lung field, and to seek immediate medical attention should they occur. Patient verbalized understanding of these instructions and education.  Patient Consent Given: Yes Education handout provided: Yes Muscles treated: Rt soleus, Rt gastroc Electrical stimulation performed: Yes Parameters: 8 mA with intensity to tolerance x 5 min Treatment response/outcome: twitch responses with reduced pain after session   07/03/23 TherEx Calf raises with L3 band 2x10; discussed off step at home letting pain be her guide  Self-Care Adjustment and fitting of stretch brace - able to get good fit with light stretch for pt; discussed  reassessing stretch every 10 min to see if it needs adjustment  Manual STM with IASTM with compression to Rt gastroc and soleus; skilled palpation and monitoring of soft tissue during DN Trigger Point Dry-Needling  Treatment instructions: Expect mild to moderate muscle soreness. S/S of pneumothorax if dry needled over a lung field, and to seek immediate medical attention should they  occur. Patient verbalized understanding of these instructions and education.  Patient Consent Given: Yes Education handout provided: Yes Muscles treated: Rt soleus, Rt gastroc Electrical stimulation performed: No Parameters: N/A Treatment response/outcome: twitch responses  06/26/23 TherEx Review of HEP   Self-Care Discussed option of LLLD stretch brace to wear as able on car rides as well as at home to see if that helps with pain after prolonged sitting - pt verbalized understanding  Manual STM with IASTM with compression to Rt gastroc and soleus; skilled palpation and monitoring of soft tissue during DN Trigger Point Dry-Needling  Treatment instructions: Expect mild to moderate muscle soreness. S/S of pneumothorax if dry needled over a lung field, and to seek immediate medical attention should they occur. Patient verbalized understanding of these instructions and education.  Patient Consent Given: Yes Education handout provided: Yes Muscles treated: Rt soleus, Rt gastroc Electrical stimulation performed: No Parameters: N/A Treatment response/outcome: twitch responses     PATIENT EDUCATION:  Education details: HEP, DN Person educated: Patient Education method: Explanation, Demonstration, and Handouts Education comprehension: verbalized understanding, returned demonstration, and needs further education  HOME EXERCISE PROGRAM: Access Code: [email or web address] URL: [email or web address] Date: 07/03/2023 Prepared by: Moshe Cipro  Exercises - Seated Ankle Eversion with Resistance  - 1-2 x daily - 7 x weekly - 1 sets - 20 reps - Ankle and Toe Plantarflexion with Resistance  - 1-2 x daily - 7 x weekly - 1 sets - 20 reps - Standing Eccentric Heel Raise  - 1-2 x daily - 7 x weekly - 1-2 sets - 10 reps - Standing Heel Raise with Band  - 1-2 x daily - 7 x weekly - 1-2 sets - 10 reps   ASSESSMENT:  CLINICAL IMPRESSION: Pt at this time with slight plateau in  symptoms, so plan is immobilization x 10-12 days and follow up with physician at that point.  Trial of DN to fibularis with good twitch response so hopefully this helps with her lateral ankle symptoms.  Will follow up after appt and determine d/c or continue PT.   OBJECTIVE IMPAIRMENTS: Abnormal gait, decreased balance, decreased mobility, difficulty walking, decreased strength, increased fascial restrictions, increased muscle spasms, and pain.   ACTIVITY LIMITATIONS: carrying, lifting, squatting, stairs, and locomotion level  PARTICIPATION LIMITATIONS: meal prep, cleaning, laundry, driving, shopping, and community activity  PERSONAL FACTORS: 1-2 comorbidities: HTN, HLD  are also affecting patient's functional outcome.   REHAB POTENTIAL: Good  CLINICAL DECISION MAKING: Evolving/moderate complexity  EVALUATION COMPLEXITY: Moderate   GOALS: Goals reviewed with patient? Yes  SHORT TERM GOALS: Target date: 07/10/2023  Independent with initial HEP Goal status: MET 07/07/23   LONG TERM GOALS: Target date: 07/31/2023  Independent with final HEP Goal status: INITIAL  2.  FOTO score improved to 66 Goal status: INITIAL  3.  Rt ankle strength improved to 5/5 for improved function and mobility Goal status: INIITAL  4.  Report pain < 3/10 with stairs and walking inclines for improved function Goal status: INITIAL  5.  Perform bil SLS to at least 10 sec for improved stability and balance Goal status: INITIAL    PLAN:  PT FREQUENCY: 1x/week  PT DURATION: 6 weeks  PLANNED INTERVENTIONS: Therapeutic exercises, Therapeutic activity, Neuromuscular re-education, Balance training, Gait training, Patient/Family education, Self Care, Joint mobilization, Joint manipulation, Stair training, Vestibular training, Canalith repositioning, Aquatic Therapy, Dry Needling, Electrical stimulation, Cryotherapy, Moist heat, Taping, Vasopneumatic device, Ultrasound, Ionotophoresis 4mg /ml Dexamethasone,  Manual therapy, and Re-evaluation.  PLAN FOR NEXT SESSION:, DN PRN,  check goals; will need recert or d/c   NEXT MD VISIT: 07/07/23  Clarita Crane, PT, DPT 07/16/23 11:25 AM

## 2023-07-23 ENCOUNTER — Encounter: Payer: BC Managed Care – PPO | Admitting: Physical Therapy

## 2023-07-24 DIAGNOSIS — J3089 Other allergic rhinitis: Secondary | ICD-10-CM | POA: Diagnosis not present

## 2023-07-24 DIAGNOSIS — J3081 Allergic rhinitis due to animal (cat) (dog) hair and dander: Secondary | ICD-10-CM | POA: Diagnosis not present

## 2023-07-24 DIAGNOSIS — J301 Allergic rhinitis due to pollen: Secondary | ICD-10-CM | POA: Diagnosis not present

## 2023-07-31 ENCOUNTER — Ambulatory Visit (INDEPENDENT_AMBULATORY_CARE_PROVIDER_SITE_OTHER): Payer: BC Managed Care – PPO | Admitting: Sports Medicine

## 2023-07-31 ENCOUNTER — Encounter: Payer: Self-pay | Admitting: Sports Medicine

## 2023-07-31 DIAGNOSIS — J3089 Other allergic rhinitis: Secondary | ICD-10-CM | POA: Diagnosis not present

## 2023-07-31 DIAGNOSIS — J3081 Allergic rhinitis due to animal (cat) (dog) hair and dander: Secondary | ICD-10-CM | POA: Diagnosis not present

## 2023-07-31 DIAGNOSIS — J301 Allergic rhinitis due to pollen: Secondary | ICD-10-CM | POA: Diagnosis not present

## 2023-07-31 DIAGNOSIS — M7751 Other enthesopathy of right foot: Secondary | ICD-10-CM | POA: Diagnosis not present

## 2023-07-31 DIAGNOSIS — M7731 Calcaneal spur, right foot: Secondary | ICD-10-CM | POA: Diagnosis not present

## 2023-07-31 DIAGNOSIS — M6788 Other specified disorders of synovium and tendon, other site: Secondary | ICD-10-CM

## 2023-07-31 DIAGNOSIS — M25571 Pain in right ankle and joints of right foot: Secondary | ICD-10-CM | POA: Diagnosis not present

## 2023-07-31 NOTE — Progress Notes (Signed)
Barbara Glenn - 62 y.o. female MRN [id number]  Date of birth: 1961/04/11  Office Visit Note: Visit Date: 07/31/2023 PCP: Pcp, No Referred by: No ref. provider found  Subjective: Chief Complaint  Patient presents with   Right Ankle - Follow-up   HPI: Barbara Glenn is a pleasant 62 y.o. female who presents today for follow-up of chronic right heel pain and Achilles insertional pain.  Prior treatments: -6 sessions of extracorporeal shockwave therapy, made relatively good improvements with this but not pain-free -Formalized physical therapy  Recently after her [place] trip from [place], she did go into the cam walker boot at my discretion.  She has worn this for about 1.5 weeks and does note rather good improvement in her pain with this brief shutdown. Is having some mild discomfort higher up near the calf.  She does continue her meloxicam 15 mg once daily.  Pertinent ROS were reviewed with the patient and found to be negative unless otherwise specified above in HPI.   Assessment & Plan: Visit Diagnoses:  1. Achilles tendinosis of right lower extremity   2. Calcaneal spur of right foot   3. Pain in right ankle and joints of right foot   4. Retrocalcaneal bursitis (back of heel), right    Plan: [name] has made good recent improvement after our brief CAM Walker shutdown for the last 1.5 weeks, as this has offloaded the Achilles and her bone marrow edema in the superior calcaneus.  Discussed with her her pain is multifactorial with distal Achilles tendinosis as well as a calcaneal spur and a degree of retrocalcaneal bursitis with edema.  Over the next week, I would like her to wean out of the cam walker boot only using it for prolonged standing and walking longer distances.  By next Monday I would like her to be out of this completely.  She will continue meloxicam 15 mg once daily for only the next few weeks.  I would like to see her back in about 10 to 14 days to reevaluate how she is  doing once out of the cam walker boot and completing her anti-inflammatories.  She may continue her formal PT in the meantime.  I did give her 5/16 inch heel lifts to wear in her shoes to help offload the Achilles.  We discussed next steps, could consider PRP injection into the distal Achilles and the retrocalcaneal bursa/calcaneus, did provide her a handout for her to read about and may ask questions when she goes through this.   Could always consider future shockwave treatments as she did have some improvement with this as well  Follow-up: Return in about 12 days (around 08/12/2023) for f/u about 1.5 weeks for R-heel/achilles.   Meds & Orders: No orders of the defined types were placed in this encounter.  No orders of the defined types were placed in this encounter.    Procedures: No procedures performed      Clinical History: No specialty comments available.  She reports that she has never smoked. She has never used smokeless tobacco. No results for input(s): "HGBA1C", "LABURIC" in the last 8760 hours.  Objective:    Physical Exam  Gen: Well-appearing, in no acute distress; non-toxic CV: Well-perfused. Warm.  Resp: Breathing unlabored on room air; no wheezing. Psych: Fluid speech in conversation; appropriate affect; normal thought process Neuro: Sensation intact throughout. No gross coordination deficits.   Ortho Exam - Right ankle/foot: Mild TTP over the distal aspect of the Achilles and the  superior calcaneus near the retrocalcaneal bursa.  Trace fluid in this location without significant warmth, no redness.  There is full range of motion with ankle plantarflexion and dorsiflexion.  Imaging:  MR Ankle Right w/o contrast CLINICAL DATA:  Right ankle pain  EXAM: MRI OF THE RIGHT ANKLE WITHOUT CONTRAST  TECHNIQUE: Multiplanar, multisequence MR imaging of the ankle was performed. No intravenous contrast was administered.  COMPARISON:  None  Available.  FINDINGS: TENDONS  Peroneal: Peroneal longus tendon intact. Moderate tendinosis of the peroneus brevis with a small partial-thickness tear just distal to the lateral malleolus.  Posteromedial: Posterior tibial tendon intact. Flexor hallucis longus tendon intact. Flexor digitorum longus tendon intact.  Anterior: Tibialis anterior tendon intact. Extensor hallucis longus tendon intact Extensor digitorum longus tendon intact.  Achilles: Moderate tendinosis of the Achilles tendon without a tear. Subcortical bone marrow edema at the Achilles insertion. Small amount of fluid in the retrocalcaneal bursa.  Plantar Fascia: Intact. Small plantar calcaneal spur.  LIGAMENTS  Lateral: Anterior talofibular ligament intact. Calcaneofibular ligament intact. Posterior talofibular ligament intact. Anterior and posterior tibiofibular ligaments intact.  Medial: Deltoid ligament intact. Spring ligament intact.  CARTILAGE  Ankle Joint: No joint effusion. Normal ankle mortise. No focal chondral defect. Mild subchondral reactive marrow changes focally in the medial corner of the talar dome.  Subtalar Joints/Sinus Tarsi: Normal subtalar joints. No subtalar joint effusion. Normal sinus tarsi.  Bones: No aggressive osseous lesion. No fracture or dislocation.  Soft Tissue: No fluid collection or hematoma. Muscles are normal without edema or atrophy. Tarsal tunnel is normal.  IMPRESSION: 1. Moderate tendinosis of the Achilles tendon without a tear and with subcortical bone marrow edema at the Achilles insertion. Mild retrocalcaneal bursitis. 2. Moderate tendinosis of the peroneus brevis with a small partial-thickness tear just distal to the lateral malleolus.  Electronically Signed   By: Elige Ko M.D.   On: 05/24/2023 13:17   Past Medical/Family/Surgical/Social History: Medications & Allergies reviewed per EMR, new medications updated. There are no problems to display for  this patient.  Past Medical History:  Diagnosis Date   Hyperlipidemia    Hypertension    History reviewed. No pertinent family history. Past Surgical History:  Procedure Laterality Date   DILATION AND CURETTAGE OF UTERUS     ENDOMETRIAL ABLATION     PLACEMENT OF BREAST IMPLANTS     Social History   Occupational History   Not on file  Tobacco Use   Smoking status: Never   Smokeless tobacco: Never  Substance and Sexual Activity   Alcohol use: Yes    Alcohol/week: 6.0 standard drinks of alcohol    Types: 6 Cans of beer per week   Drug use: Never   Sexual activity: Not on file

## 2023-07-31 NOTE — Progress Notes (Signed)
Patient has been wearing the tall cam boot since the Monday after her trip (about a week and a half) and believes that it has helped. She describes discomfort when she is out of the boot (minimally at home) and says that she has felt a burning or uncomfortable feeling a couple of times more recently. She says that this feeling is a bit higher on her leg than the initial pain and discomfort. Patient has been taking meloxicam and thinks she maybe missed two doses.

## 2023-07-31 NOTE — Patient Instructions (Signed)
Dr. Shon Baton' Instructions and What to Expect for PRP Injections:  Platelet-rich plasma is used in musculoskeletal medicine to focus your own body's ability to heal. It has several well-done published research trials (RCT) which demonstrate both its effectiveness and safety in many musculoskeletal conditions, including osteoarthritis, tendinopathies, partial tendon tears, and damaged vertebral discs. PRP has been in clinical use since the 1990's. Many people know that platelets form a clot if there is a cut in the skin. It turns out that platelets do not only form a clot, but they also start the body's own repair process. When platelets activate to form a clot, they release alpha granules which have hundreds of chemical messengers in them that initiate and organize repair to the damaged tissue. Precisely placing PRP into the site of injury will initiate the healing process by activating on the damaged cartilage, bone or tendon. This is an inflammatory process, and inflammation is the vital first phase of healing.  What to expect and how to prepare for PRP:   Depending on the procedure, you may need to arrange for a driver to bring you home (i.e. procedure on right/driving leg). IF you are having a lower extremity procedure, we can provide crutches as needed, but these are not routinely needed.   10 days prior to the procedure: Stop taking anti-inflammatory drugs like Ibuprofen/Motrin, Advil, Naprosyn, Celebrex, or Meloxicam. Even aspirin should be stopped (but need to discuss this with Dr. Shon Baton and your cardiologist beforehand). Let Dr. Shon Baton know if you have been taking prednisone or other corticosteroids in the last 30 days as this can negatively interfere with the PRP process.   The day before the procedure: thoroughly shower and clean your skin.    The day of the procedure: Wear loose-fitting clothing like sweatpants or shorts. If you are having an upper body procedure wear a top that can button or  zip up. Please show up at least 15 minutes early for your appointment, as we will need to take you back to draw your blood prior to the procedure.    Things to avoid prior to procedure: tobacco/nicotine, alcohol, fatty foods. Tobacco is a potent toxin and its use constricts small blood vessels which are needed for tissue repair. Tobacco/nicotine use will limit the effectiveness of any treatment and stopping tobacco use is one of the single greatest actions you can take to improve your health. Avoid toxins like alcohol, which inhibits and depresses the cells needed for tissue repair.  PRP will initiate healing and a productive inflammation, and PRP therapy will make the body part treated sore for the first 3-4 days up to two weeks. Anti-inflammatory drugs (i.e. ibuprofen, Naprosyn, Celebrex) and corticosteroids such as prednisone can blunt or stop this process, so it is important to not take any anti-inflammatory drugs for 10 days before getting PRP therapy, or for at least two weeks after PRP therapy.   Depending on the body part injected, you may be in a sling or on crutches for several days. Most of the time these are not needed, but it is important to allow time for the body part to rest after the injection. By keeping the body part treated relaxed and not loading the body part the PRP can bind in place and do its job. If you push the body part too early, this may make the PRP less effective.  What happens during the PRP procedure?  Platelet rich plasma is made by taking some of your blood and performing a  two-stage centrifuge process on it to concentrate the PRP. First, your blood is drawn into a syringe with a small amount of anti-coagulant in it (this is to keep the blood from clotting during this process). The amount of blood drawn is usually about 10-30 milliliters, depending on how much PRP is needed for the treatment. Then the blood is transferred in a sterile fashion into a centrifuge tube. It  is then centrifuged for the first cycle where the red blood cells are isolated and discarded. In the second centrifuge cycle, the platelet-rich fraction of the remaining plasma is concentrated and placed in a syringe. The skin at the injection site is numbed with a small amount of topical cooling spray. Dr. Shon Baton will then precisely inject the PRP into the injury site using ultrasound guidance.  What to do after your procedure:   NO anti-inflammatories (Ibuprofen/Motrin, Aleve, Meloxicam, etc.) for 2 weeks after injection. It is OK to use Tylenol only if needed. I can also prescribe you specific medicine to control any discomfort you may have after the procedure if needed.   NO applying ice to the affected area for 2 weeks after the injection. It is OK to use heat.   Rest the affected body part. By keeping the body part treated relaxed and not loading the body part the PRP can bind in place and do its job. Be sure to ask Dr. Shon Baton specific post-injection rest and guidelines to follow following your injection, as these will differ slightly depending on what type of PRP injection you receive. In general:   Allow 3-4 days of rest from physical labor or repetitive activity for the affected body part. It is ok to move the area, but no lifting or strenuous activity. After 3 days, unless otherwise instructed, the treated body part should be used and slowly moved through its full range of motion. It will be sore, but you will not damage it by moving it, in fact it needs to move to heal.   No strenuous activity, lifting weights, or dedicated therapy until the 2-week mark from the injection. After 2 weeks from the injection, this is when it is most advantageous to start physical therapy.   After two weeks, you may begin returning to activity, but it is a good idea to avoid activities that specifically hurt you before being treated. Exercise is vital to good health and finding a way to cross train around your  injury is important not only for your physical health, but your mental health as well. Ask me about cross training options for your injury. Some brief (10 minutes or less) period of heat therapy will not hurt the rehab, but it is not required. Usually, depending on the initial injury, physical therapy is started from two weeks to three weeks after injection. Improvements in pain and function should be expected from 6 weeks to 12 weeks after injection and some injuries may require more than one treatment.

## 2023-08-03 ENCOUNTER — Ambulatory Visit: Payer: BC Managed Care – PPO | Admitting: Physical Therapy

## 2023-08-03 ENCOUNTER — Encounter: Payer: Self-pay | Admitting: Physical Therapy

## 2023-08-03 DIAGNOSIS — R29898 Other symptoms and signs involving the musculoskeletal system: Secondary | ICD-10-CM

## 2023-08-03 DIAGNOSIS — M25571 Pain in right ankle and joints of right foot: Secondary | ICD-10-CM

## 2023-08-03 DIAGNOSIS — R42 Dizziness and giddiness: Secondary | ICD-10-CM

## 2023-08-03 NOTE — Therapy (Signed)
OUTPATIENT PHYSICAL THERAPY TREATMENT RECERTIFICATION   Patient Name: Barbara Glenn MRN: [id number] DOB:May 13, 1961, 62 y.o., female Today's Date: 08/03/2023  END OF SESSION:  PT End of Session - 08/03/23 0935     Visit Number 6    Number of Visits 12    Date for PT Re-Evaluation 09/14/23    Authorization Type [employer] 30% coinsurance; 30 visit limit    PT Start Time 0934    PT Stop Time 1000    PT Time Calculation (min) 26 min    Activity Tolerance Patient tolerated treatment well                  Past Medical History:  Diagnosis Date   Hyperlipidemia    Hypertension    Past Surgical History:  Procedure Laterality Date   DILATION AND CURETTAGE OF UTERUS     ENDOMETRIAL ABLATION     PLACEMENT OF BREAST IMPLANTS     There are no problems to display for this patient.   PCP: Pcp, No  REFERRING PROVIDER: Madelyn Brunner, DO  REFERRING DIAG: [phone] (ICD-10-CM) - Achilles tendinosis of right lower extremity  Rationale for Evaluation and Treatment: Rehabilitation  THERAPY DIAG:  Pain in right ankle and joints of right foot - Plan: PT plan of care cert/re-cert  Other symptoms and signs involving the musculoskeletal system - Plan: PT plan of care cert/re-cert  Dizziness and giddiness - Plan: PT plan of care cert/re-cert  ONSET DATE: Sept 2023   SUBJECTIVE:                                                                                                                                                                                           SUBJECTIVE STATEMENT: Frustrated with situation; doesn't want to have surgery   PERTINENT HISTORY:  HLD, HTN  PAIN:  Are you having pain? Yes: NPRS scale: 0 currently, 5/10 Pain location: Rt heel/achilles Pain description: stretching, pulling Aggravating factors: [place] worse in [place] Relieving factors: rest, shockwave therapy  PRECAUTIONS:  None  RED FLAGS: None   WEIGHT BEARING  RESTRICTIONS:  No  FALLS:  Has patient fallen in last 6 months? 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 Moderate tendinosis of the Achilles tendon without a tear and with subcortical bone marrow edema at the Achilles insertion. Mild retrocalcaneal bursitis. 2. Moderate tendinosis of the peroneus brevis with a small partial-thickness tear just distal to the lateral malleolus.  PATIENT SURVEYS:  06/19/23: FOTO 56 (predicted 66) 07/07/23: FOTO 55  COGNITIVE STATUS: Within functional limits for tasks assessed   SENSATION: WFL  POSTURE:  rounded shoulders and forward head  GAIT: 06/19/23 Comments: mild antalgic gait   PALPATION: Trigger points noted in Rt soleus  LOWER EXTREMITY ROM:     ROM Right eval Left eval  Ankle dorsiflexion A: 5 P:10   Ankle plantarflexion A: 59   Ankle inversion A: 22   Ankle eversion A: 13    (Blank rows = not tested)   LOWER EXTREMITY MMT:    MMT Right eval Left eval  Ankle dorsiflexion 5/5   Ankle plantarflexion 4/5   Ankle inversion 5/5   Ankle eversion 4/5    (Blank rows = not tested)    FUNCTIONAL TESTS:  06/19/23:  SLS: RLE: 5 sec; LLE: 7 sec   TREATMENT:                                                                                                                              DATE:  08/03/23 Self Care Discussed with pt about recent physician visit; she is discouraged with current progress and future options.  Discussed acetic acid iontophoresis with pt as option and spoke with referring provider as well.  Plan to  get Rx for acetic acid and pt to trial iontophoresis.  07/16/23 Manual STM with IASTM with compression to Rt gastroc, soleus and fibularis; skilled palpation and monitoring of soft tissue during DN Trigger Point Dry-Needling  Treatment instructions: Expect mild to moderate muscle soreness. S/S of pneumothorax if dry needled over a lung field, and to seek immediate medical attention should they occur. Patient verbalized understanding of these instructions and education.  Patient Consent Given: Yes Education handout provided: Yes Muscles treated: Rt soleus, Rt gastroc, Rt fibularis Electrical stimulation performed: Yes Parameters: 8 mA with intensity to tolerance x 5 min to gastroc Treatment response/outcome: twitch responses with reduced pain after session  07/06/23 Self-Care Discussed shoes and recommendation to consider returning shoes and trial of different pair that doesn't put her foot into too much pronation.  Also discussed seeing how foot feels after being in the brace to see if that's helping   Manual STM with IASTM with compression to Rt gastroc and soleus; skilled palpation and monitoring of soft tissue during DN Trigger Point Dry-Needling  Treatment instructions: Expect mild to moderate muscle soreness. S/S of pneumothorax if dry needled over a lung field, and to seek immediate medical attention should they occur. Patient verbalized understanding of these instructions and education.  Patient Consent Given: Yes Education handout provided: Yes Muscles treated: Rt soleus, Rt gastroc Electrical stimulation performed: Yes Parameters: 8 mA with  intensity to tolerance x 5 min Treatment response/outcome: twitch responses with reduced pain after session   07/03/23 TherEx Calf raises with L3 band 2x10; discussed off step at home letting pain be her guide  Self-Care Adjustment and fitting of stretch brace - able to get good fit with light stretch for pt; discussed reassessing stretch  every 10 min to see if it needs adjustment  Manual STM with IASTM with compression to Rt gastroc and soleus; skilled palpation and monitoring of soft tissue during DN Trigger Point Dry-Needling  Treatment instructions: Expect mild to moderate muscle soreness. S/S of pneumothorax if dry needled over a lung field, and to seek immediate medical attention should they occur. Patient verbalized understanding of these instructions and education.  Patient Consent Given: Yes Education handout provided: Yes Muscles treated: Rt soleus, Rt gastroc Electrical stimulation performed: No Parameters: N/A Treatment response/outcome: twitch responses   PATIENT EDUCATION:  Education details: HEP, DN Person educated: Patient Education method: Explanation, Demonstration, and Handouts Education comprehension: verbalized understanding, returned demonstration, and needs further education  HOME EXERCISE PROGRAM: Access Code: [email or web address] URL: [email or web address] Date: 07/03/2023 Prepared by: Moshe Cipro  Exercises - Seated Ankle Eversion with Resistance  - 1-2 x daily - 7 x weekly - 1 sets - 20 reps - Ankle and Toe Plantarflexion with Resistance  - 1-2 x daily - 7 x weekly - 1 sets - 20 reps - Standing Eccentric Heel Raise  - 1-2 x daily - 7 x weekly - 1-2 sets - 10 reps - Standing Heel Raise with Band  - 1-2 x daily - 7 x weekly - 1-2 sets - 10 reps   ASSESSMENT:  CLINICAL IMPRESSION: Pt returns to PT after trial of boot, which is still needed due to pain.  At this time plan to continue all goals and trial of iontophoresis with acetic acid to see if this help with pain.  Will continue to benefit from PT to maximize function.   OBJECTIVE IMPAIRMENTS: Abnormal gait, decreased balance, decreased mobility, difficulty walking, decreased strength, increased fascial restrictions, increased muscle spasms, and pain.   ACTIVITY LIMITATIONS: carrying, lifting, squatting, stairs, and  locomotion level  PARTICIPATION LIMITATIONS: meal prep, cleaning, laundry, driving, shopping, and community activity  PERSONAL FACTORS: 1-2 comorbidities: HTN, HLD  are also affecting patient's functional outcome.   REHAB POTENTIAL: Good  CLINICAL DECISION MAKING: Evolving/moderate complexity  EVALUATION COMPLEXITY: Moderate   GOALS: Goals reviewed with patient? Yes  SHORT TERM GOALS: Target date: 07/10/2023  Independent with initial HEP Goal status: MET 07/07/23   LONG TERM GOALS: Target date: 09/14/2023  Independent with final HEP Goal status: ONGOING 08/03/23  2.  FOTO score improved to 66 Goal status: ONGOING 08/03/23  3.  Rt ankle strength improved to 5/5 for improved function and mobility Goal status: ONGOING 08/03/23  4.  Report pain < 3/10 with stairs and walking inclines for improved function Goal status: ONGOING 08/03/23  5.  Perform bil SLS to at least 10 sec for improved stability and balance Goal status: ONGOING 08/03/23    PLAN:  PT FREQUENCY: 1x/week  PT DURATION: 6 weeks  PLANNED INTERVENTIONS: Therapeutic exercises, Therapeutic activity, Neuromuscular re-education, Balance training, Gait training, Patient/Family education, Self Care, Joint mobilization, Joint manipulation, Stair training, Vestibular training, Canalith repositioning, Aquatic Therapy, Dry Needling, Electrical stimulation, Cryotherapy, Moist heat, Taping, Vasopneumatic device, Ultrasound, Ionotophoresis 4mg /ml Dexamethasone, Manual therapy, Re-evaluation, and iontophoresis with acetic acid .  PLAN FOR NEXT SESSION: trial iontophoresis with acetic acid   NEXT MD VISIT:  08/10/23  Clarita Crane, PT, DPT 08/03/23 10:17 AM

## 2023-08-06 ENCOUNTER — Encounter: Payer: Self-pay | Admitting: Physical Therapy

## 2023-08-06 ENCOUNTER — Telehealth: Payer: Self-pay | Admitting: Sports Medicine

## 2023-08-06 NOTE — Telephone Encounter (Signed)
Patient called and LM on the PT line. She says the RX wasn't called in for her. She does not know the name of the medication. Her cb# [phone]

## 2023-08-12 ENCOUNTER — Other Ambulatory Visit: Payer: Self-pay | Admitting: Sports Medicine

## 2023-08-12 DIAGNOSIS — J301 Allergic rhinitis due to pollen: Secondary | ICD-10-CM | POA: Diagnosis not present

## 2023-08-12 DIAGNOSIS — J3081 Allergic rhinitis due to animal (cat) (dog) hair and dander: Secondary | ICD-10-CM | POA: Diagnosis not present

## 2023-08-12 DIAGNOSIS — J3089 Other allergic rhinitis: Secondary | ICD-10-CM | POA: Diagnosis not present

## 2023-08-14 ENCOUNTER — Encounter: Payer: Self-pay | Admitting: Sports Medicine

## 2023-08-14 ENCOUNTER — Ambulatory Visit (INDEPENDENT_AMBULATORY_CARE_PROVIDER_SITE_OTHER): Payer: BC Managed Care – PPO | Admitting: Sports Medicine

## 2023-08-14 DIAGNOSIS — M6788 Other specified disorders of synovium and tendon, other site: Secondary | ICD-10-CM

## 2023-08-14 DIAGNOSIS — M7751 Other enthesopathy of right foot: Secondary | ICD-10-CM | POA: Diagnosis not present

## 2023-08-14 DIAGNOSIS — M7731 Calcaneal spur, right foot: Secondary | ICD-10-CM

## 2023-08-14 MED ORDER — MELOXICAM 15 MG PO TABS: ORAL_TABLET | ORAL | 0 refills | Status: DC

## 2023-08-14 NOTE — Progress Notes (Signed)
Barbara Glenn - 62 y.o. female MRN [id number]  Date of birth: September 03, 1961  Office Visit Note: Visit Date: 08/14/2023 PCP: Pcp, No Referred by: No ref. provider found  Subjective: Chief Complaint  Patient presents with   Right Heel - Follow-up   HPI: Barbara Glenn is a very pleasant 62 y.o. female who presents today for follow-up chronic right heel pain with bone spur, Achilles insertional tendinopathy and retrocalcaneal bursitis.  Barbara Glenn has made good progress with her shutdown in a cam walker, she is working on weaning out of this and only using it for long distances.  She is finding benefit from meloxicam 15 mg once daily as well.  Currently feels like she is at least 70% improved.  She is working on getting started with acetic acid ionotophoresis with PT.  She and her husband, who was on the phone today have some questions about additional treatment options.  Prior treatments: -6 sessions of extracorporeal shockwave therapy, made relatively good improvements with this but not pain-free -Formalized physical therapy - Meloxicam 15mg   Pertinent ROS were reviewed with the patient and found to be negative unless otherwise specified above in HPI.   Assessment & Plan: Visit Diagnoses:  1. Achilles tendinosis of right lower extremity   2. Calcaneal spur of right foot   3. Retrocalcaneal bursitis (back of heel), right    Plan: Barbara Glenn is making good progress and even more recent reduction in pain with the brief CAM walker shutdown and activity modification.  At this point she will only use the cam walker when she is walking long distances at the grocery store, I would like her to come out of this at all other times.  She may use her bilateral heel lifts in her shoes to help offload the tendon.  She will continue meloxicam 15 mg once daily, once her pain continues to improve I am okay with her transitioning this to as needed.  Did get some benefit from extracorporeal shockwave therapy, we will plan  to repeat a few sessions to see if she gets further benefit.  We did discuss the role for PRP injection therapy for the distal Achilles and the retrocalcaneal bursa region, we will see if she gets improved benefit over the next month and if she does not we can likely proceed with this treatment.  Discussed that surgical intervention would be a last resort and I do not think it is a good option at this time.  Follow-up next week for reevaluation and ESWT.  Follow-up: Return for next week for Achilles (reg visit) .   Meds & Orders: No orders of the defined types were placed in this encounter.  No orders of the defined types were placed in this encounter.    Procedures: No procedures performed      Clinical History: No specialty comments available.  She reports that she has never smoked. She has never used smokeless tobacco. No results for input(s): "HGBA1C", "LABURIC" in the last 8760 hours.  Objective:    Physical Exam  Gen: Well-appearing, in no acute distress; non-toxic CV: Well-perfused. Warm.  Resp: Breathing unlabored on room air; no wheezing. Psych: Fluid speech in conversation; appropriate affect; normal thought process Neuro: Sensation intact throughout. No gross coordination deficits.   Ortho Exam - Right heel/foot: Very minimal TTP over the calcaneus, no TTP over the Achilles.  Good range of motion with plantarflexion/dorsiflexion. NO ankle effusion.  Imaging: No results found.  Past Medical/Family/Surgical/Social History: Medications & Allergies reviewed  per EMR, new medications updated. There are no problems to display for this patient.  Past Medical History:  Diagnosis Date   Hyperlipidemia    Hypertension    History reviewed. No pertinent family history. Past Surgical History:  Procedure Laterality Date   DILATION AND CURETTAGE OF UTERUS     ENDOMETRIAL ABLATION     PLACEMENT OF BREAST IMPLANTS     Social History   Occupational History   Not on file   Tobacco Use   Smoking status: Never   Smokeless tobacco: Never  Substance and Sexual Activity   Alcohol use: Yes    Alcohol/week: 6.0 standard drinks of alcohol    Types: 6 Cans of beer per week   Drug use: Never   Sexual activity: Not on file

## 2023-08-14 NOTE — Progress Notes (Signed)
Patient says that she is feeling better; she still has discomfort in the same place. She says she spends about half of the time out of the boot, and still wears it when she goes out. Patient says she is still taking Meloxicam consistently.

## 2023-08-20 ENCOUNTER — Ambulatory Visit: Payer: BC Managed Care – PPO | Admitting: Sports Medicine

## 2023-08-20 DIAGNOSIS — J3089 Other allergic rhinitis: Secondary | ICD-10-CM | POA: Diagnosis not present

## 2023-08-21 DIAGNOSIS — J3081 Allergic rhinitis due to animal (cat) (dog) hair and dander: Secondary | ICD-10-CM | POA: Diagnosis not present

## 2023-08-21 DIAGNOSIS — J3089 Other allergic rhinitis: Secondary | ICD-10-CM | POA: Diagnosis not present

## 2023-08-21 DIAGNOSIS — J301 Allergic rhinitis due to pollen: Secondary | ICD-10-CM | POA: Diagnosis not present

## 2023-08-26 DIAGNOSIS — J3089 Other allergic rhinitis: Secondary | ICD-10-CM | POA: Diagnosis not present

## 2023-08-26 DIAGNOSIS — J301 Allergic rhinitis due to pollen: Secondary | ICD-10-CM | POA: Diagnosis not present

## 2023-08-26 DIAGNOSIS — J3081 Allergic rhinitis due to animal (cat) (dog) hair and dander: Secondary | ICD-10-CM | POA: Diagnosis not present

## 2023-08-27 ENCOUNTER — Ambulatory Visit (INDEPENDENT_AMBULATORY_CARE_PROVIDER_SITE_OTHER): Payer: BC Managed Care – PPO | Admitting: Sports Medicine

## 2023-08-27 ENCOUNTER — Encounter: Payer: Self-pay | Admitting: Sports Medicine

## 2023-08-27 DIAGNOSIS — M7751 Other enthesopathy of right foot: Secondary | ICD-10-CM | POA: Diagnosis not present

## 2023-08-27 DIAGNOSIS — M6788 Other specified disorders of synovium and tendon, other site: Secondary | ICD-10-CM

## 2023-08-27 DIAGNOSIS — M7731 Calcaneal spur, right foot: Secondary | ICD-10-CM

## 2023-08-27 NOTE — Progress Notes (Signed)
Barbara Glenn - 62 y.o. female MRN [id number]  Date of birth: [birth year]  Office Visit Note: Visit Date: 08/27/2023 PCP: Pcp, No Referred by: No ref. provider found  Subjective: Chief Complaint  Patient presents with   Right Heel - Follow-up   HPI: Barbara Glenn is a pleasant 62 y.o. female who presents today for follow-up chronic right heel pain with bone spur, Achilles insertional tendinopathy and retrocalcaneal bursitis.   Overall, she still feels like she is doing much better.  Certainly less tenderness in the back of the heel.  Following wearing her walking boot with long walking such as at the grocery store. She is working on getting started with acetic acid ionotophoresis with PT. Has meloxicam 15 mg daily as needed.  Pertinent ROS were reviewed with the patient and found to be negative unless otherwise specified above in HPI.   Assessment & Plan: Visit Diagnoses:  1. Retrocalcaneal bursitis (back of heel), right   2. Achilles tendinosis of right lower extremity   3. Calcaneal spur of right foot    Plan: Barbara Glenn is making progress in her distal Achilles tendinosis and retrocalcaneal bursitis.  We did repeat extracorporeal shockwave therapy today, we will plan for a few more sessions to see if she gets continued benefit.  I would like her to wean herself out of the cam walker boot and only use it for walking long distances, otherwise she will come out of this.  She has meloxicam 15 mg to use once daily only as needed.  She will continue her HEP and PT once she has her acetic acid ionotophoresis.  She will follow-up with me next week for repeat ESWT evaluation.  Additional treatment option: PRP injection for distal Achilles and retrocalcaneal bursa  Follow-up: Return for f/u for R-heel in 1 week and then next f/u in 2 weeks with Shon Baton (reg visit).   Meds & Orders: No orders of the defined types were placed in this encounter.  No orders of the defined types were placed in this  encounter.    Procedures: Procedure: ECSWT Indications:  Achilles tendinopathy   Procedure Details Consent: Risks of procedure as well as the alternatives and risks of each were explained to the patient.  Verbal consent for procedure obtained. Time Out: Verified patient identification, verified procedure, site was marked, verified correct patient position. The area was cleaned with alcohol swab.     The right Achilles and superior calcaneus was targeted for Extracorporeal shockwave therapy.    Preset: Achillodynia Power Level: 90 mJ Frequency: 11 Hz Impulse/cycles: 2750 (300 at superior calcaneus) Head size: Regular   Patient tolerated procedure well without immediate complications.         Clinical History: No specialty comments available.  She reports that she has never smoked. She has never used smokeless tobacco. No results for input(s): "HGBA1C", "LABURIC" in the last 8760 hours.  Objective:    Physical Exam  Gen: Well-appearing, in no acute distress; non-toxic CV: Well-perfused. Warm.  Resp: Breathing unlabored on room air; no wheezing. Psych: Fluid speech in conversation; appropriate affect; normal thought process Neuro: Sensation intact throughout. No gross coordination deficits.   Ortho Exam - Right foot/ankle: Very mild TTP around the superior calcaneus and retrocalcaneal bursa region, no tenderness to palpation over the Achilles.  Good range of motion about the ankle. No swelling.  Imaging: No results found.  Past Medical/Family/Surgical/Social History: Medications & Allergies reviewed per EMR, new medications updated. There are no problems to display  for this patient.  Past Medical History:  Diagnosis Date   Hyperlipidemia    Hypertension    No family history on file. Past Surgical History:  Procedure Laterality Date   DILATION AND CURETTAGE OF UTERUS     ENDOMETRIAL ABLATION     PLACEMENT OF BREAST IMPLANTS     Social History   Occupational  History   Not on file  Tobacco Use   Smoking status: Never   Smokeless tobacco: Never  Substance and Sexual Activity   Alcohol use: Yes    Alcohol/week: 6.0 standard drinks of alcohol    Types: 6 Cans of beer per week   Drug use: Never   Sexual activity: Not on file

## 2023-09-03 ENCOUNTER — Ambulatory Visit: Payer: BC Managed Care – PPO | Admitting: Sports Medicine

## 2023-09-03 ENCOUNTER — Encounter: Payer: Self-pay | Admitting: Sports Medicine

## 2023-09-03 DIAGNOSIS — M7751 Other enthesopathy of right foot: Secondary | ICD-10-CM

## 2023-09-03 DIAGNOSIS — M7731 Calcaneal spur, right foot: Secondary | ICD-10-CM | POA: Diagnosis not present

## 2023-09-03 DIAGNOSIS — M6788 Other specified disorders of synovium and tendon, other site: Secondary | ICD-10-CM | POA: Diagnosis not present

## 2023-09-03 NOTE — Progress Notes (Signed)
Barbara Glenn - 62 y.o. female MRN [id number]  Date of birth: 11-Aug-1961  Office Visit Note: Visit Date: 09/03/2023 PCP: Pcp, No Referred by: No ref. provider found  Subjective: Chief Complaint  Patient presents with   Right Heel - Follow-up   HPI: Barbara Glenn is a pleasant 62 y.o. female who presents today for follow-up chronic right heel pain with bone spur, Achilles insertional tendinopathy and retrocalcaneal bursitis.   Heel is doing somewhat better.  She knows this because she has not had to wear her boot hardly at all in the house.  She does wear it when she is out walking a lot with groceries, etc.  She is not wearing it today.  She is still working on getting started with the acetic acid ionophoresis, there is a delay on her acetic acid.  She continues on meloxicam 15 mg daily.  Not really having pain, having some burning over the posterior heel.  Pertinent ROS were reviewed with the patient and found to be negative unless otherwise specified above in HPI.   Assessment & Plan: Visit Diagnoses:  1. Retrocalcaneal bursitis (back of heel), right   2. Achilles tendinosis of right lower extremity   3. Calcaneal spur of right foot    Plan: [name] has made some progress with this restart of extracorporeal shockwave therapy, we did repeat treatment today.  I would still like to see how she does paring this with her acetic acid iontophoresis, she will work on getting this set up.  She will continue her meloxicam 15 mg daily for the remainder of her treatment course and then discontinue.  She has been able to wean herself largely out of the cam walker boot, which she will continue.  Continuing heel lifts.  She will follow-up in 2 weeks for reevaluation and repeat use ESWT.   We have discussed in the future if she does not get relief from this and her ionophoresis with PT, could consider PRP into the retrocalcaneal bursa and distal Achilles.  Follow-up: Return in about 2 weeks (around  09/17/2023).   Meds & Orders: No orders of the defined types were placed in this encounter.  No orders of the defined types were placed in this encounter.    Procedures: Procedure: ECSWT Indications:  Achilles tendinopathy   Procedure Details Consent: Risks of procedure as well as the alternatives and risks of each were explained to the patient.  Verbal consent for procedure obtained. Time Out: Verified patient identification, verified procedure, site was marked, verified correct patient position. The area was cleaned with alcohol swab.     The right Achilles and superior calcaneus was targeted for Extracorporeal shockwave therapy.    Preset: Achillodynia Power Level: 100 mJ Frequency: 11-12 Hz Impulse/cycles: 3000 (300 at superior calcaneus and RC bursa) Head size: Regular   Patient tolerated procedure well without immediate complications.          Clinical History: No specialty comments available.  She reports that she has never smoked. She has never used smokeless tobacco. No results for input(s): "HGBA1C", "LABURIC" in the last 8760 hours.  Objective:    Physical Exam  Gen: Well-appearing, in no acute distress; non-toxic CV: Well-perfused. Warm.  Resp: Breathing unlabored on room air; no wheezing. Psych: Fluid speech in conversation; appropriate affect; normal thought process Neuro: Sensation intact throughout. No gross coordination deficits.   Ortho Exam - RLE: No Achilles TTP or calcaneal TTP.  There is very mild tenderness over the retrocalcaneal region  with trace swelling.  Good range of motion about the ankle.  Imaging: No results found.  Past Medical/Family/Surgical/Social History: Medications & Allergies reviewed per EMR, new medications updated. There are no problems to display for this patient.  Past Medical History:  Diagnosis Date   Hyperlipidemia    Hypertension    No family history on file. Past Surgical History:  Procedure Laterality Date    DILATION AND CURETTAGE OF UTERUS     ENDOMETRIAL ABLATION     PLACEMENT OF BREAST IMPLANTS     Social History   Occupational History   Not on file  Tobacco Use   Smoking status: Never   Smokeless tobacco: Never  Substance and Sexual Activity   Alcohol use: Yes    Alcohol/week: 6.0 standard drinks of alcohol    Types: 6 Cans of beer per week   Drug use: Never   Sexual activity: Not on file

## 2023-09-03 NOTE — Progress Notes (Signed)
Patient says that she is feeling okay. She has not worn her boot in a few days. She said that she was able to walk around a couple of stores in [facility] on Tuesday without the boot and felt fine, and yesterday mostly rested. She says that she has some burning on the inside of the foot/heel when she is walking but this is not all the time.

## 2023-09-04 DIAGNOSIS — J301 Allergic rhinitis due to pollen: Secondary | ICD-10-CM | POA: Diagnosis not present

## 2023-09-04 DIAGNOSIS — J3081 Allergic rhinitis due to animal (cat) (dog) hair and dander: Secondary | ICD-10-CM | POA: Diagnosis not present

## 2023-09-04 DIAGNOSIS — J3089 Other allergic rhinitis: Secondary | ICD-10-CM | POA: Diagnosis not present

## 2023-09-07 DIAGNOSIS — F411 Generalized anxiety disorder: Secondary | ICD-10-CM | POA: Diagnosis not present

## 2023-09-07 DIAGNOSIS — G4700 Insomnia, unspecified: Secondary | ICD-10-CM | POA: Diagnosis not present

## 2023-09-07 DIAGNOSIS — R21 Rash and other nonspecific skin eruption: Secondary | ICD-10-CM | POA: Diagnosis not present

## 2023-09-07 DIAGNOSIS — Z23 Encounter for immunization: Secondary | ICD-10-CM | POA: Diagnosis not present

## 2023-09-07 DIAGNOSIS — F331 Major depressive disorder, recurrent, moderate: Secondary | ICD-10-CM | POA: Diagnosis not present

## 2023-09-17 ENCOUNTER — Other Ambulatory Visit: Payer: Self-pay | Admitting: Sports Medicine

## 2023-09-17 ENCOUNTER — Encounter: Payer: Self-pay | Admitting: Sports Medicine

## 2023-09-17 ENCOUNTER — Ambulatory Visit: Payer: BC Managed Care – PPO | Admitting: Sports Medicine

## 2023-09-17 DIAGNOSIS — M7731 Calcaneal spur, right foot: Secondary | ICD-10-CM

## 2023-09-17 DIAGNOSIS — M7751 Other enthesopathy of right foot: Secondary | ICD-10-CM | POA: Diagnosis not present

## 2023-09-17 DIAGNOSIS — M6788 Other specified disorders of synovium and tendon, other site: Secondary | ICD-10-CM

## 2023-09-17 NOTE — Progress Notes (Signed)
Barbara Glenn - 62 y.o. female MRN [id number]  Date of birth: October 10, 1961  Office Visit Note: Visit Date: 09/17/2023 PCP: Pcp, No Referred by: No ref. provider found  Subjective: Chief Complaint  Patient presents with   Right Heel - Follow-up   HPI: Barbara Glenn is a pleasant 62 y.o. female who presents today for chronic right heel pain with bone spur, Achilles insertional tendinopathy and retrocalcaneal bursitis.   We have now performed two consecutive treatments here recently back to back of extracorporeal shockwave therapy for her distal Achilles and posterior calcaneus.  She has not yet gotten started with her acetic acid ionophoresis and physical therapy. After the last ESWT, she states she had a few days of soreness following the treatment but for the last 3-4 days she is and she was pain-free.  She has not needed to use the boot at all during that time.  Her husband was on the telephone today, but he thinks that she is not making much improvement.  She did have an MRI of the ankle back in July and has been dealing with this for quite a while now.  Pertinent ROS were reviewed with the patient and found to be negative unless otherwise specified above in HPI.   Assessment & Plan: Visit Diagnoses:  1. Achilles tendinosis of right lower extremity   2. Calcaneal spur of right foot   3. Retrocalcaneal bursitis (back of heel), right    Plan: Barbara Glenn continues with waxing and waning posterior heel pain with a degree of both retrocalcaneal bursitis and Achilles tendinopathy at the distal attachment.  She has had some relief of her pain with extracorporeal shockwave therapy, but she has not had significant long-lasting pain relief from this, we will hold on future treatments at this time.  She is going to schedule a PT appointment to help set up her acetic acid ionophoresis which she can then do at home.  Discussed trialing this for about 1 month and returning to activity as tolerated.  If she  continues to have improvements in her pain she will continue this, if she does not we discussed the next options would be consideration of PRP injection for the distal Achilles and the retrocalcaneal bursa region.  The other option would be sending her for evaluation for a surgical evaluation with Dr. Lajoyce Corners.  I did discuss this with both her and her husband over the phone today. May continue in her heel lifts and HEP in the meantime.  Follow-up: Return for she will message/call me in 49-month for improvement.   Meds & Orders: No orders of the defined types were placed in this encounter.   Orders Placed This Encounter  Procedures   Ambulatory referral to Physical Therapy     Procedures:     Clinical History: No specialty comments available.  She reports that she has never smoked. She has never used smokeless tobacco. No results for input(s): "HGBA1C", "LABURIC" in the last 8760 hours.  Objective:    Physical Exam  Gen: Well-appearing, in no acute distress; non-toxic CV: Well-perfused. Warm.  Resp: Breathing unlabored on room air; no wheezing. Psych: Fluid speech in conversation; appropriate affect; normal thought process Neuro: Sensation intact throughout. No gross coordination deficits.   Ortho Exam - RLE: Nonantalgic gait today.  Good range of motion about the ankle joint.  Imaging: No results found.  Past Medical/Family/Surgical/Social History: Medications & Allergies reviewed per EMR, new medications updated. There are no problems to display for this  patient.  Past Medical History:  Diagnosis Date   Hyperlipidemia    Hypertension    History reviewed. No pertinent family history. Past Surgical History:  Procedure Laterality Date   DILATION AND CURETTAGE OF UTERUS     ENDOMETRIAL ABLATION     PLACEMENT OF BREAST IMPLANTS     Social History   Occupational History   Not on file  Tobacco Use   Smoking status: Never   Smokeless tobacco: Never  Substance and Sexual  Activity   Alcohol use: Yes    Alcohol/week: 6.0 standard drinks of alcohol    Types: 6 Cans of beer per week   Drug use: Never   Sexual activity: Not on file

## 2023-09-17 NOTE — Progress Notes (Signed)
Patient says that she may have tolerated too much last visit and that she had pain for about 3 days after. She says that following that, she had 3 days that were pain free, and today she is having some pain again.

## 2023-09-18 DIAGNOSIS — J3081 Allergic rhinitis due to animal (cat) (dog) hair and dander: Secondary | ICD-10-CM | POA: Diagnosis not present

## 2023-09-18 DIAGNOSIS — J3089 Other allergic rhinitis: Secondary | ICD-10-CM | POA: Diagnosis not present

## 2023-09-18 DIAGNOSIS — J301 Allergic rhinitis due to pollen: Secondary | ICD-10-CM | POA: Diagnosis not present

## 2023-09-21 DIAGNOSIS — J3081 Allergic rhinitis due to animal (cat) (dog) hair and dander: Secondary | ICD-10-CM | POA: Diagnosis not present

## 2023-09-21 DIAGNOSIS — J301 Allergic rhinitis due to pollen: Secondary | ICD-10-CM | POA: Diagnosis not present

## 2023-09-21 DIAGNOSIS — R21 Rash and other nonspecific skin eruption: Secondary | ICD-10-CM | POA: Diagnosis not present

## 2023-09-21 DIAGNOSIS — J3089 Other allergic rhinitis: Secondary | ICD-10-CM | POA: Diagnosis not present

## 2023-09-23 ENCOUNTER — Ambulatory Visit: Payer: BC Managed Care – PPO | Admitting: Physical Therapy

## 2023-09-23 ENCOUNTER — Encounter: Payer: Self-pay | Admitting: Physical Therapy

## 2023-09-23 DIAGNOSIS — M25571 Pain in right ankle and joints of right foot: Secondary | ICD-10-CM

## 2023-09-23 DIAGNOSIS — R29898 Other symptoms and signs involving the musculoskeletal system: Secondary | ICD-10-CM | POA: Diagnosis not present

## 2023-09-23 NOTE — Therapy (Signed)
OUTPATIENT PHYSICAL THERAPY TREATMENT RECERTIFICATION   Patient Name: Barbara Glenn MRN: [id number] DOB:09-18-1961, 62 y.o., female Today's Date: 09/23/2023  END OF SESSION:  PT End of Session - 09/23/23 1207     Visit Number 7    Number of Visits 13    Date for PT Re-Evaluation 11/04/23    Authorization Type BCBS 30% coinsurance; 30 visit limit    PT Start Time 1140    PT Stop Time 1205    PT Time Calculation (min) 25 min    Activity Tolerance Patient tolerated treatment well                   Past Medical History:  Diagnosis Date   Hyperlipidemia    Hypertension    Past Surgical History:  Procedure Laterality Date   DILATION AND CURETTAGE OF UTERUS     ENDOMETRIAL ABLATION     PLACEMENT OF BREAST IMPLANTS     There are no problems to display for this patient.   PCP: Pcp, No  REFERRING PROVIDER: Madelyn Brunner, DO  REFERRING DIAG: [phone] (ICD-10-CM) - Achilles tendinosis of right lower extremity  Rationale for Evaluation and Treatment: Rehabilitation  THERAPY DIAG:  Pain in right ankle and joints of right foot - Plan: PT plan of care cert/re-cert  Other symptoms and signs involving the musculoskeletal system - Plan: PT plan of care cert/re-cert  ONSET DATE: Sept 2023   SUBJECTIVE:                                                                                                                                                                                           SUBJECTIVE STATEMENT: Pain is 5/10 today; wants to know how to use ionto patches for acetic acid   PERTINENT HISTORY:  HLD, HTN  PAIN:  Are you having pain? Yes: NPRS scale: 5/10 Pain location: Rt heel/achilles Pain description: stretching, pulling Aggravating factors: [place] worse in [place] Relieving factors: rest, shockwave therapy  PRECAUTIONS:  None  RED FLAGS: None   WEIGHT BEARING RESTRICTIONS:  No  FALLS:  Has patient fallen in last 6  months? 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 Moderate tendinosis  of the Achilles tendon without a tear and with subcortical bone marrow edema at the Achilles insertion. Mild retrocalcaneal bursitis. 2. Moderate tendinosis of the peroneus brevis with a small partial-thickness tear just distal to the lateral malleolus.  PATIENT SURVEYS:  06/19/23: FOTO 56 (predicted 66) 07/07/23: FOTO 55  COGNITIVE STATUS: Within functional limits for tasks assessed   SENSATION: WFL  POSTURE:  rounded shoulders and forward head  GAIT: 06/19/23 Comments: mild antalgic gait   PALPATION: Trigger points noted in Rt soleus  LOWER EXTREMITY ROM:     ROM Right eval Left eval  Ankle dorsiflexion A: 5 P:10   Ankle plantarflexion A: 59   Ankle inversion A: 22   Ankle eversion A: 13    (Blank rows = not tested)   LOWER EXTREMITY MMT:    MMT Right eval Left eval  Ankle dorsiflexion 5/5   Ankle plantarflexion 4/5   Ankle inversion 5/5   Ankle eversion 4/5    (Blank rows = not tested)    FUNCTIONAL TESTS:  06/19/23:  SLS: RLE: 5 sec; LLE: 7 sec   TREATMENT:                                                                                                                              DATE:  09/23/23 Iontophoresis Session spent educating on application of ionto patch stat with acetic acid 5% solution.  Educated on medication and saline amount and process, as well as where to apply patch.  Did need to use single strip of KT tape to hold in place.  Pt performed application with all  questions answered to Rt achilles tendon/posterior heel.  TherEx Recommended holding on exercises at this time until after application of 6 patches.  Pt verbalized understanding.   08/03/23 Self Care Discussed with pt about recent physician visit; she is discouraged with current progress and future options.  Discussed acetic acid iontophoresis with pt as option and spoke with referring provider as well.  Plan to get Rx for acetic acid and pt to trial iontophoresis.  07/16/23 Manual STM with IASTM with compression to Rt gastroc, soleus and fibularis; skilled palpation and monitoring of soft tissue during DN Trigger Point Dry-Needling  Treatment instructions: Expect mild to moderate muscle soreness. S/S of pneumothorax if dry needled over a lung field, and to seek immediate medical attention should they occur. Patient verbalized understanding of these instructions and education.  Patient Consent Given: Yes Education handout provided: Yes Muscles treated: Rt soleus, Rt gastroc, Rt fibularis Electrical stimulation performed: Yes Parameters: 8 mA with intensity to tolerance x 5 min to gastroc Treatment response/outcome: twitch responses with reduced pain after session  07/06/23 Self-Care Discussed shoes and recommendation to consider returning shoes and trial of different pair that doesn't put her foot into too much pronation.  Also discussed seeing how foot feels after being in the brace to see if that's helping   Manual STM with IASTM with compression  to Rt gastroc and soleus; skilled palpation and monitoring of soft tissue during DN Trigger Point Dry-Needling  Treatment instructions: Expect mild to moderate muscle soreness. S/S of pneumothorax if dry needled over a lung field, and to seek immediate medical attention should they occur. Patient verbalized understanding of these instructions and education.  Patient Consent Given: Yes Education handout provided: Yes Muscles treated: Rt  soleus, Rt gastroc Electrical stimulation performed: Yes Parameters: 8 mA with intensity to tolerance x 5 min Treatment response/outcome: twitch responses with reduced pain after session     PATIENT EDUCATION:  Education details: HEP, DN Person educated: Patient Education method: Explanation, Demonstration, and Handouts Education comprehension: verbalized understanding, returned demonstration, and needs further education  HOME EXERCISE PROGRAM: Access Code: [email or web address] URL: [email or web address] Date: 07/03/2023 Prepared by: Moshe Cipro  Exercises - Seated Ankle Eversion with Resistance  - 1-2 x daily - 7 x weekly - 1 sets - 20 reps - Ankle and Toe Plantarflexion with Resistance  - 1-2 x daily - 7 x weekly - 1 sets - 20 reps - Standing Eccentric Heel Raise  - 1-2 x daily - 7 x weekly - 1-2 sets - 10 reps - Standing Heel Raise with Band  - 1-2 x daily - 7 x weekly - 1-2 sets - 10 reps   ASSESSMENT:  CLINICAL IMPRESSION: Session today focused on education of iontophoresis patch for acetic acid to Rt achilles tendon and posterior heel.  Pt to continue this at home x 6 sessions every 2-3 days and will return to PT following this.  At that time will plan to reassess and continue as indicated.  Continue all goals at this time.   OBJECTIVE IMPAIRMENTS: Abnormal gait, decreased balance, decreased mobility, difficulty walking, decreased strength, increased fascial restrictions, increased muscle spasms, and pain.   ACTIVITY LIMITATIONS: carrying, lifting, squatting, stairs, and locomotion level  PARTICIPATION LIMITATIONS: meal prep, cleaning, laundry, driving, shopping, and community activity  PERSONAL FACTORS: 1-2 comorbidities: HTN, HLD  are also affecting patient's functional outcome.   REHAB POTENTIAL: Good  CLINICAL DECISION MAKING: Evolving/moderate complexity  EVALUATION COMPLEXITY: Moderate   GOALS: Goals reviewed with patient? Yes  SHORT TERM GOALS:  Target date: 07/10/2023  Independent with initial HEP Goal status: MET 07/07/23   LONG TERM GOALS: Target date: 11/04/2023  Independent with final HEP Goal status: ONGOING 09/23/23  2.  FOTO score improved to 66 Goal status: ONGOING 09/23/23  3.  Rt ankle strength improved to 5/5 for improved function and mobility Goal status: ONGOING 09/23/23  4.  Report pain < 3/10 with stairs and walking inclines for improved function Goal status: ONGOING 09/23/23  5.  Perform bil SLS to at least 10 sec for improved stability and balance Goal status: ONGOING 09/23/23    PLAN:  PT FREQUENCY: 1x/week  PT DURATION: 6 weeks  PLANNED INTERVENTIONS: Therapeutic exercises, Therapeutic activity, Neuromuscular re-education, Balance training, Gait training, Patient/Family education, Self Care, Joint mobilization, Joint manipulation, Stair training, Vestibular training, Canalith repositioning, Aquatic Therapy, Dry Needling, Electrical stimulation, Cryotherapy, Moist heat, Taping, Vasopneumatic device, Ultrasound, Ionotophoresis 4mg /ml Dexamethasone, Manual therapy, Re-evaluation, and iontophoresis with acetic acid .  PLAN FOR NEXT SESSION: how is pain now?  Reassess and hopefully begin exercises again   NEXT MD VISIT: 10/15/23  Clarita Crane, PT, DPT 09/23/23 12:15 PM

## 2023-09-29 DIAGNOSIS — I10 Essential (primary) hypertension: Secondary | ICD-10-CM | POA: Diagnosis not present

## 2023-09-29 DIAGNOSIS — G4700 Insomnia, unspecified: Secondary | ICD-10-CM | POA: Diagnosis not present

## 2023-09-29 DIAGNOSIS — F411 Generalized anxiety disorder: Secondary | ICD-10-CM | POA: Diagnosis not present

## 2023-09-29 DIAGNOSIS — F331 Major depressive disorder, recurrent, moderate: Secondary | ICD-10-CM | POA: Diagnosis not present

## 2023-09-30 ENCOUNTER — Ambulatory Visit: Payer: BC Managed Care – PPO | Admitting: Physical Therapy

## 2023-10-02 DIAGNOSIS — J3081 Allergic rhinitis due to animal (cat) (dog) hair and dander: Secondary | ICD-10-CM | POA: Diagnosis not present

## 2023-10-02 DIAGNOSIS — J301 Allergic rhinitis due to pollen: Secondary | ICD-10-CM | POA: Diagnosis not present

## 2023-10-02 DIAGNOSIS — J3089 Other allergic rhinitis: Secondary | ICD-10-CM | POA: Diagnosis not present

## 2023-10-13 NOTE — Therapy (Signed)
OUTPATIENT PHYSICAL THERAPY TREATMENT   Patient Name: Barbara Glenn MRN: [id number] DOB:1961/06/10, 62 y.o., female Today's Date: 10/14/2023  END OF SESSION:  PT End of Session - 10/14/23 1152     Visit Number 8    Number of Visits 13    Date for PT Re-Evaluation 11/04/23    Authorization Type [employer] 30% coinsurance; 30 visit limit    PT Start Time 1152    PT Stop Time 1215    PT Time Calculation (min) 23 min    Activity Tolerance Patient tolerated treatment well                    Past Medical History:  Diagnosis Date   Hyperlipidemia    Hypertension    Past Surgical History:  Procedure Laterality Date   DILATION AND CURETTAGE OF UTERUS     ENDOMETRIAL ABLATION     PLACEMENT OF BREAST IMPLANTS     There are no active problems to display for this patient.   PCP: Pcp, No  REFERRING PROVIDER: Madelyn Brunner, DO  REFERRING DIAG: [phone] (ICD-10-CM) - Achilles tendinosis of right lower extremity  Rationale for Evaluation and Treatment: Rehabilitation  THERAPY DIAG:  Pain in right ankle and joints of right foot  Other symptoms and signs involving the musculoskeletal system  ONSET DATE: Sept 2023   SUBJECTIVE:                                                                                                                                                                                           SUBJECTIVE STATEMENT: Still having some pain; better than it was.  Hurts everyday now.  When she wears a slight heel she has no pain.      PERTINENT HISTORY:  HLD, HTN  PAIN:  Are you having pain? Yes: NPRS scale: 3/10 Pain location: Rt heel/achilles Pain description: stretching, pulling Aggravating factors: [place]; worse in [place] Relieving factors: rest, shockwave therapy  PRECAUTIONS:  None  RED FLAGS: None   WEIGHT BEARING RESTRICTIONS:  No  FALLS:  Has patient fallen in last 6 months? 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 Moderate tendinosis of the  Achilles tendon without a tear and with subcortical bone marrow edema at the Achilles insertion. Mild retrocalcaneal bursitis. 2. Moderate tendinosis of the peroneus brevis with a small partial-thickness tear just distal to the lateral malleolus.  PATIENT SURVEYS:  06/19/23: FOTO 56 (predicted 66) 07/07/23: FOTO 55  COGNITIVE STATUS: Within functional limits for tasks assessed   SENSATION: WFL  POSTURE:  rounded shoulders and forward head  GAIT: 06/19/23 Comments: mild antalgic gait   PALPATION: Trigger points noted in Rt soleus  LOWER EXTREMITY ROM:     ROM Right eval Left eval  Ankle dorsiflexion A: 5 P:10   Ankle plantarflexion A: 59   Ankle inversion A: 22   Ankle eversion A: 13    (Blank rows = not tested)   LOWER EXTREMITY MMT:    MMT Right eval Left eval  Ankle dorsiflexion 5/5   Ankle plantarflexion 4/5   Ankle inversion 5/5   Ankle eversion 4/5    (Blank rows = not tested)    FUNCTIONAL TESTS:  06/19/23:  SLS: RLE: 5 sec; LLE: 7 sec   TREATMENT:                                                                                                                              DATE:  10/14/23 Discussed current progress, provided new heel lift as well as additional ionto supplies.  She will continue this and follow up with Dr. Shon Baton tomorrow.  Recommended exercises as tolerated and pain allows.  Will await additional recommendations from physician at this time.  09/23/23 Iontophoresis Session spent educating  on application of ionto patch stat with acetic acid 5% solution.  Educated on medication and saline amount and process, as well as where to apply patch.  Did need to use single strip of KT tape to hold in place.  Pt performed application with all questions answered to Rt achilles tendon/posterior heel.  TherEx Recommended holding on exercises at this time until after application of 6 patches.  Pt verbalized understanding.   08/03/23 Self Care Discussed with pt about recent physician visit; she is discouraged with current progress and future options.  Discussed acetic acid iontophoresis with pt as option and spoke with referring provider as well.  Plan to get Rx for acetic acid and pt to trial iontophoresis.  07/16/23 Manual STM with IASTM with compression to Rt gastroc, soleus and fibularis; skilled palpation and monitoring of soft tissue during DN Trigger Point Dry-Needling  Treatment instructions: Expect mild to moderate muscle soreness. S/S of pneumothorax if dry needled over a lung field, and to seek immediate medical attention should they occur. Patient verbalized understanding of these instructions and education.  Patient Consent Given: Yes Education handout provided: Yes Muscles treated: Rt soleus, Rt gastroc, Rt fibularis Electrical stimulation performed: Yes Parameters: 8 mA with intensity to tolerance x 5 min to gastroc Treatment response/outcome: twitch responses with reduced pain after session  07/06/23 Self-Care Discussed shoes and recommendation  to consider returning shoes and trial of different pair that doesn't put her foot into too much pronation.  Also discussed seeing how foot feels after being in the brace to see if that's helping   Manual STM with IASTM with compression to Rt gastroc and soleus; skilled palpation and monitoring of soft tissue during DN Trigger Point Dry-Needling  Treatment instructions: Expect mild to moderate muscle soreness. S/S of pneumothorax if  dry needled over a lung field, and to seek immediate medical attention should they occur. Patient verbalized understanding of these instructions and education.  Patient Consent Given: Yes Education handout provided: Yes Muscles treated: Rt soleus, Rt gastroc Electrical stimulation performed: Yes Parameters: 8 mA with intensity to tolerance x 5 min Treatment response/outcome: twitch responses with reduced pain after session     PATIENT EDUCATION:  Education details: HEP, DN Person educated: Patient Education method: Explanation, Demonstration, and Handouts Education comprehension: verbalized understanding, returned demonstration, and needs further education  HOME EXERCISE PROGRAM: Access Code: [email or web address] URL: [email or web address] Date: 07/03/2023 Prepared by: Moshe Cipro  Exercises - Seated Ankle Eversion with Resistance  - 1-2 x daily - 7 x weekly - 1 sets - 20 reps - Ankle and Toe Plantarflexion with Resistance  - 1-2 x daily - 7 x weekly - 1 sets - 20 reps - Standing Eccentric Heel Raise  - 1-2 x daily - 7 x weekly - 1-2 sets - 10 reps - Standing Heel Raise with Band  - 1-2 x daily - 7 x weekly - 1-2 sets - 10 reps   ASSESSMENT:  CLINICAL IMPRESSION: Pt with some improvement in her pain since last session but still limited.  She will follow up with her physician tomorrow to discuss next steps.  Provided additional ionto supplies as pain has mildly improved.     OBJECTIVE IMPAIRMENTS: Abnormal gait, decreased balance, decreased mobility, difficulty walking, decreased strength, increased fascial restrictions, increased muscle spasms, and pain.   ACTIVITY LIMITATIONS: carrying, lifting, squatting, stairs, and locomotion level  PARTICIPATION LIMITATIONS: meal prep, cleaning, laundry, driving, shopping, and community activity  PERSONAL FACTORS: 1-2 comorbidities: HTN, HLD  are also affecting patient's functional outcome.   REHAB POTENTIAL:  Good  CLINICAL DECISION MAKING: Evolving/moderate complexity  EVALUATION COMPLEXITY: Moderate   GOALS: Goals reviewed with patient? Yes  SHORT TERM GOALS: Target date: 07/10/2023  Independent with initial HEP Goal status: MET 07/07/23   LONG TERM GOALS: Target date: 11/04/2023  Independent with final HEP Goal status: ONGOING 09/23/23  2.  FOTO score improved to 66 Goal status: ONGOING 09/23/23  3.  Rt ankle strength improved to 5/5 for improved function and mobility Goal status: ONGOING 09/23/23  4.  Report pain < 3/10 with stairs and walking inclines for improved function Goal status: ONGOING 09/23/23  5.  Perform bil SLS to at least 10 sec for improved stability and balance Goal status: ONGOING 09/23/23    PLAN:  PT FREQUENCY: 1x/week  PT DURATION: 6 weeks  PLANNED INTERVENTIONS: Therapeutic exercises, Therapeutic activity, Neuromuscular re-education, Balance training, Gait training, Patient/Family education, Self Care, Joint mobilization, Joint manipulation, Stair training, Vestibular training, Canalith repositioning, Aquatic Therapy, Dry Needling, Electrical stimulation, Cryotherapy, Moist heat, Taping, Vasopneumatic device, Ultrasound, Ionotophoresis 4mg /ml Dexamethasone, Manual therapy, Re-evaluation, and iontophoresis with acetic acid .  PLAN FOR NEXT SESSION: what did Dr. Shon Baton say?   NEXT MD VISIT: 10/15/23  Clarita Crane, PT, DPT 10/14/23 12:47 PM

## 2023-10-14 ENCOUNTER — Ambulatory Visit (INDEPENDENT_AMBULATORY_CARE_PROVIDER_SITE_OTHER): Payer: Self-pay | Admitting: Physical Therapy

## 2023-10-14 ENCOUNTER — Encounter: Payer: Self-pay | Admitting: Physical Therapy

## 2023-10-14 DIAGNOSIS — R29898 Other symptoms and signs involving the musculoskeletal system: Secondary | ICD-10-CM

## 2023-10-14 DIAGNOSIS — M25571 Pain in right ankle and joints of right foot: Secondary | ICD-10-CM | POA: Diagnosis not present

## 2023-10-15 ENCOUNTER — Ambulatory Visit: Payer: BC Managed Care – PPO | Admitting: Sports Medicine

## 2023-10-15 ENCOUNTER — Encounter: Payer: Self-pay | Admitting: Sports Medicine

## 2023-10-15 DIAGNOSIS — L400 Psoriasis vulgaris: Secondary | ICD-10-CM | POA: Diagnosis not present

## 2023-10-15 DIAGNOSIS — L920 Granuloma annulare: Secondary | ICD-10-CM | POA: Diagnosis not present

## 2023-10-15 DIAGNOSIS — M7731 Calcaneal spur, right foot: Secondary | ICD-10-CM | POA: Diagnosis not present

## 2023-10-15 DIAGNOSIS — M6788 Other specified disorders of synovium and tendon, other site: Secondary | ICD-10-CM

## 2023-10-15 DIAGNOSIS — M7751 Other enthesopathy of right foot: Secondary | ICD-10-CM | POA: Diagnosis not present

## 2023-10-15 DIAGNOSIS — D0359 Melanoma in situ of other part of trunk: Secondary | ICD-10-CM | POA: Diagnosis not present

## 2023-10-15 DIAGNOSIS — L821 Other seborrheic keratosis: Secondary | ICD-10-CM | POA: Diagnosis not present

## 2023-10-15 DIAGNOSIS — L308 Other specified dermatitis: Secondary | ICD-10-CM | POA: Diagnosis not present

## 2023-10-15 DIAGNOSIS — L812 Freckles: Secondary | ICD-10-CM | POA: Diagnosis not present

## 2023-10-15 MED ORDER — MELOXICAM 15 MG PO TABS: ORAL_TABLET | ORAL | 0 refills | Status: DC

## 2023-10-15 NOTE — Progress Notes (Signed)
Barbara Glenn - 62 y.o. female MRN [id number]  Date of birth: Oct 07, 1961  Office Visit Note: Visit Date: 10/15/2023 PCP: Pcp, No Referred by: No ref. provider found  Subjective: Chief Complaint  Patient presents with   Right Heel - Follow-up   HPI: Barbara Glenn is a pleasant 62 y.o. female who presents today for chronic right heel pain with bone spur, Achilles insertional tendinopathy and retrocalcaneal bursitis.   It has been about a month since our last visit, in the meantime she has been performing acetic acid ionophoresis treatments and has had 6 so far.  She also discussed with physical therapy starting in a stretching program for the Achilles but has not started yet.  In general has very small degree of improvement but still has pain that limits her walking and physical activity.  She has not yet tried walking outside or for longer distances.  Pertinent ROS were reviewed with the patient and found to be negative unless otherwise specified above in HPI.   Assessment & Plan: Visit Diagnoses:  1. Achilles tendinosis of right lower extremity   2. Calcaneal spur of right foot   3. Retrocalcaneal bursitis (back of heel), right    Plan: [name] has had ongoing posterior heel pain that has waxed and waned over the past 6 months or so. This is multifactorial with bone marrow edema of the calcaneus with distal insertional achilles tendinopathy and MRI confirmed RC-bursitis. She did get fairly decent relief with a number of extracorporeal shockwave therapy but then this plateaued.  She has done ionophoresis, PT, and short shutdown in CAM walker boot but still having pain.  At this point, I would like her to see Dr. Lajoyce Corners first for a surgical evaluation to see if he feels this would give her the best success at healing and resolution of her pathology.  I did discuss with Ritta, the only other treatment I would recommend short of this would be considering PRP injection into the distal Achilles and  retrocalcaneal bursa region.  I will pass this along to Dr. Lajoyce Corners, she will see him and then we will reconvene after this meeting to discuss next steps moving forward. Will restart Meloxicam 15mg  once daily for a few weeks as this was helpful in the past.  Follow-up: Return for make appt with Dr. Lajoyce Corners to discuss poss surgery .   Meds & Orders:  Meds ordered this encounter  Medications   meloxicam (MOBIC) 15 MG tablet    Sig: TAKE 1 TABLET BY MOUTH EVERY DAY AS NEEDED    Dispense:  30 tablet    Refill:  0   No orders of the defined types were placed in this encounter.    Procedures: No procedures performed      Clinical History: No specialty comments available.  She reports that she has never smoked. She has never used smokeless tobacco. No results for input(s): "HGBA1C", "LABURIC" in the last 8760 hours.  Objective:    Physical Exam  Gen: Well-appearing, in no acute distress; non-toxic CV:  Well-perfused. Warm.  Resp: Breathing unlabored on room air; no wheezing. Psych: Fluid speech in conversation; appropriate affect; normal thought process Neuro: Sensation intact throughout. No gross coordination deficits.   Ortho Exam - Right foot/ankle: Very mild TTP with retrocalcaneal squeeze and at the mid to distal aspect of the Achilles tendon.  There is no tenderness to palpation or with squeeze of the calcaneus itself.  Good range of motion about the ankle joint.  Imaging:  MR Ankle Right w/o contrast CLINICAL DATA:  Right ankle pain  EXAM: MRI OF THE RIGHT ANKLE WITHOUT CONTRAST  TECHNIQUE: Multiplanar, multisequence MR imaging of the ankle was performed. No intravenous contrast was administered.  COMPARISON:  None Available.  FINDINGS: TENDONS  Peroneal: Peroneal longus tendon intact. Moderate tendinosis of the peroneus brevis with a small partial-thickness tear just distal to the lateral malleolus.  Posteromedial: Posterior tibial tendon intact. Flexor  hallucis longus tendon intact. Flexor digitorum longus tendon intact.  Anterior: Tibialis anterior tendon intact. Extensor hallucis longus tendon intact Extensor digitorum longus tendon intact.  Achilles: Moderate tendinosis of the Achilles tendon without a tear. Subcortical bone marrow edema at the Achilles insertion. Small amount of fluid in the retrocalcaneal bursa.  Plantar Fascia: Intact. Small plantar calcaneal spur.  LIGAMENTS  Lateral: Anterior talofibular ligament intact. Calcaneofibular ligament intact. Posterior talofibular ligament intact. Anterior and posterior tibiofibular ligaments intact.  Medial: Deltoid ligament intact. Spring ligament intact.  CARTILAGE  Ankle Joint: No joint effusion. Normal ankle mortise. No focal chondral defect. Mild subchondral reactive marrow changes focally in the medial corner of the talar dome.  Subtalar Joints/Sinus Tarsi: Normal subtalar joints. No subtalar joint effusion. Normal sinus tarsi.  Bones: No aggressive osseous lesion. No fracture or dislocation.  Soft Tissue: No fluid collection or hematoma. Muscles are normal without edema or atrophy. Tarsal tunnel is normal.  IMPRESSION: 1. Moderate tendinosis of the Achilles tendon without a tear and with subcortical bone marrow edema at the Achilles insertion. Mild retrocalcaneal bursitis. 2. Moderate tendinosis of the peroneus brevis with a small partial-thickness tear just distal to the lateral malleolus.  Electronically Signed   By: Elige Ko M.D.   On: 05/24/2023 13:17  Past Medical/Family/Surgical/Social History: Medications & Allergies reviewed per EMR, new medications updated. There are no active problems to display for this patient.  Past Medical History:  Diagnosis Date   Hyperlipidemia    Hypertension    History reviewed. No pertinent family history. Past Surgical History:  Procedure Laterality Date   DILATION AND CURETTAGE OF UTERUS     ENDOMETRIAL  ABLATION     PLACEMENT OF BREAST IMPLANTS     Social History   Occupational History   Not on file  Tobacco Use   Smoking status: Never   Smokeless tobacco: Never  Substance and Sexual Activity   Alcohol use: Yes    Alcohol/week: 6.0 standard drinks of alcohol    Types: 6 Cans of beer per week   Drug use: Never   Sexual activity: Not on file

## 2023-10-15 NOTE — Progress Notes (Signed)
Patient says that she is maybe feeling a little better but she is still having pain. She says that she had 6 patches from PT and is going to try 6 more.

## 2023-10-15 NOTE — Patient Instructions (Addendum)
[name],  We discussed your chronic right heel pain -this is a combination of Achilles wear and tear (tendinopathy) as well as a degree of bursitis in the heel area (retrocalcaneal bursitis).  -At this point, I suggested:  1.) Seeing Dr. Lajoyce Corners (foot/ankle surgeon) - to discuss if he feels surgery would give you the best chance at full recovery.  You can ask him what this would entail as well as expected recovery.  We discussed trialing PRP (platelet-rich plasma) injection therapy prior to this, you can get his thoughts on this as well.  2.) The other option would be considering PRP injection therapy, which I would do in the Achilles tendon as well as the retrocalcaneal bursa in the heel. This would be the next step prior to discussing surgery (talk with Dr. Lajoyce Corners first)  *We also discussed, I would like you to get back into walking and resuming physical activity to see what your pain level is really at.  I am okay with some general soreness and low-level pain, this is to be expected. You will resume your stretching and other rehab exercises that Judeth Cornfield (PT) gave you as well. You will continue your acetic acid ionopheresis treatments as well.  Once you see Dr. Lajoyce Corners, we will reconvene whether he feels surgery is warranted or if we would try PRP prior to this.  You can send me a message or give me a call after this visit to discuss how we would like to proceed.  - Dr. Shon Baton

## 2023-10-19 DIAGNOSIS — J3089 Other allergic rhinitis: Secondary | ICD-10-CM | POA: Diagnosis not present

## 2023-10-19 DIAGNOSIS — J301 Allergic rhinitis due to pollen: Secondary | ICD-10-CM | POA: Diagnosis not present

## 2023-10-19 DIAGNOSIS — J3081 Allergic rhinitis due to animal (cat) (dog) hair and dander: Secondary | ICD-10-CM | POA: Diagnosis not present

## 2023-10-26 DIAGNOSIS — L920 Granuloma annulare: Secondary | ICD-10-CM | POA: Diagnosis not present

## 2023-10-26 DIAGNOSIS — F411 Generalized anxiety disorder: Secondary | ICD-10-CM | POA: Diagnosis not present

## 2023-10-26 DIAGNOSIS — G4700 Insomnia, unspecified: Secondary | ICD-10-CM | POA: Diagnosis not present

## 2023-10-26 DIAGNOSIS — F331 Major depressive disorder, recurrent, moderate: Secondary | ICD-10-CM | POA: Diagnosis not present

## 2023-10-30 DIAGNOSIS — J3089 Other allergic rhinitis: Secondary | ICD-10-CM | POA: Diagnosis not present

## 2023-11-01 NOTE — Progress Notes (Signed)
 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  Labs from PCP, Dr. Almarie Scala:           12/15/2022: Iron 147, Ferritin 444.2 (H) 03/09/2023:Ferritin 598.57 (H), iron 152, saturation 60% (H), TIBC 254 (L).  04/28/2023: Establish care with [facility].  Found to be heterozygous for the C282Y mutation  Interval History:  Barbara Glenn 63 y.o. female with medical history significant for elevated ferritin levels who presents for a follow up visit. The patient's last visit was on 04/28/2023. In the interim since the last visit her genetic testing returned and showed she was heterozygous for the C282Y mutation.   On exam today [name] reports that she has had no major changes in her health in the interim since her last visit 6 months ago.  She denies any fevers, chills, sweats, nausea, vomiting or diarrhea.  The bulk of our discussion focused on the diagnosis of a heterozygous C282Y mutation and what to expect moving forward.  We also discussed genetic counseling for her children, particularly before they have children themselves.  The patient voiced understanding of our findings and plan moving forward.  MEDICAL HISTORY:  Past Medical History:  Diagnosis Date   Hyperlipidemia    Hypertension     SURGICAL HISTORY: Past Surgical History:  Procedure Laterality Date   DILATION AND CURETTAGE OF UTERUS     ENDOMETRIAL ABLATION     PLACEMENT OF BREAST IMPLANTS      SOCIAL HISTORY: Social History   Socioeconomic History   Marital status: Married    Spouse name: Not on file   Number of children: Not on file   Years of education: Not on file   Highest education level: Not on file  Occupational History   Not on file  Tobacco Use   Smoking status: Never   Smokeless tobacco: Never  Substance and Sexual  Activity   Alcohol use: Yes    Alcohol/week: 6.0 standard drinks of alcohol    Types: 6 Cans of beer per week   Drug use: Never   Sexual activity: Not on file  Other Topics Concern   Not on file  Social History Narrative   Not on file   Social Drivers of Health   Financial Resource Strain: Not on file  Food Insecurity: No Food Insecurity (04/28/2023)   Hunger Vital Sign    Worried About Running Out of Food in the Last Year: Never true    Ran Out of Food in the Last Year: Never true  Transportation Needs: No Transportation Needs (04/28/2023)   PRAPARE - Administrator, Civil Service (Medical): No    Lack of Transportation (Non-Medical): No  Physical Activity: Not on file  Stress: Not on file  Social Connections: Unknown (03/08/2022)   [facility]   Social Network    Social Network: Not on file  Intimate Partner Violence: Not At Risk (04/28/2023)   Humiliation, Afraid, Rape, and Kick questionnaire    Fear of Current or Ex-Partner: No    Emotionally Abused: No    Physically Abused: No    Sexually Abused: No    FAMILY HISTORY: No family history on file.  ALLERGIES:  has no known allergies.  MEDICATIONS:  Current Outpatient Medications  Medication Sig Dispense Refill   azelastine (ASTELIN) 0.1 %  nasal spray Place into both nostrils.     buPROPion (WELLBUTRIN XL) 150 MG 24 hr tablet Take 150 mg by mouth every morning.     EPINEPHrine 0.3 mg/0.3 mL IJ SOAJ injection See admin instructions.     escitalopram (LEXAPRO) 20 MG tablet Take 20 mg by mouth daily.     ezetimibe (ZETIA) 10 MG tablet Take 10 mg by mouth daily.     meloxicam  (MOBIC ) 15 MG tablet TAKE 1 TABLET BY MOUTH EVERY DAY AS NEEDED 30 tablet 0   olmesartan-hydrochlorothiazide (BENICAR HCT) 20-12.5 MG tablet Take 1 tablet by mouth daily.     zolpidem (AMBIEN) 5 MG tablet Take 5 mg by mouth at bedtime as needed.     No current facility-administered medications for this visit.     REVIEW OF SYSTEMS:   Constitutional: ( - ) fevers, ( - )  chills , ( - ) night sweats Eyes: ( - ) blurriness of vision, ( - ) double vision, ( - ) watery eyes Ears, nose, mouth, throat, and face: ( - ) mucositis, ( - ) sore throat Respiratory: ( - ) cough, ( - ) dyspnea, ( - ) wheezes Cardiovascular: ( - ) palpitation, ( - ) chest discomfort, ( - ) lower extremity swelling Gastrointestinal:  ( - ) nausea, ( - ) heartburn, ( - ) change in bowel habits Skin: ( - ) abnormal skin rashes Lymphatics: ( - ) new lymphadenopathy, ( - ) easy bruising Neurological: ( - ) numbness, ( - ) tingling, ( - ) new weaknesses Behavioral/Psych: ( - ) mood change, ( - ) new changes  All other systems were reviewed with the patient and are negative.  PHYSICAL EXAMINATION: Vitals:   11/02/23 1408  BP: (!) 140/81  Pulse: 70  Resp: 13  Temp: 97.9 F (36.6 C)  SpO2: 100%   Filed Weights   11/02/23 1408  Weight: 188 lb 1.6 oz (85.3 kg)    GENERAL: Well-appearing middle-age Caucasian female, alert, no distress and comfortable SKIN: skin color, texture, turgor are normal, no rashes or significant lesions EYES: conjunctiva are pink and non-injected, sclera clear LUNGS: clear to auscultation and percussion with normal breathing effort HEART: regular rate & rhythm and no murmurs and no lower extremity edema Musculoskeletal: no cyanosis of digits and no clubbing  PSYCH: alert & oriented x 3, fluent speech NEURO: no focal motor/sensory deficits  LABORATORY DATA:  I have reviewed the data as listed    Latest Ref Rng & Units 11/02/2023    1:48 PM 04/28/2023   10:21 AM  CBC  WBC 4.0 - 10.5 K/uL 6.3  4.9   Hemoglobin 12.0 - 15.0 g/dL 86.1  87.3   Hematocrit 36.0 - 46.0 % 39.9  36.5   Platelets 150 - 400 K/uL 272  254        Latest Ref Rng & Units 11/02/2023    1:48 PM 04/28/2023   10:21 AM  CMP  Glucose 70 - 99 mg/dL 890  98   BUN 8 - 23 mg/dL 14  13   Creatinine 9.55 - 1.00 mg/dL 9.22  9.20    Sodium 864 - 145 mmol/L 128  137   Potassium 3.5 - 5.1 mmol/L 4.2  3.9   Chloride 98 - 111 mmol/L 95  103   CO2 22 - 32 mmol/L 26  28   Calcium 8.9 - 10.3 mg/dL 9.6  8.7   Total Protein 6.5 - 8.1 g/dL 6.9  5.9  Total Bilirubin 0.0 - 1.2 mg/dL 0.7  0.4   Alkaline Phos 38 - 126 U/L 74  56   AST 15 - 41 U/L 49  19   ALT 0 - 44 U/L 57  17     RADIOGRAPHIC STUDIES: No results found.  ASSESSMENT & PLAN KERSTIN CRUSOE 63 y.o. female with medical history significant for elevated ferritin levels who presents for a follow up visit.  # Elevated Ferritin # C282Y Heterozygous Mutation -- At this time there is no need for phlebotomy or intervention for this patient's ferritin.  Recommend yearly ferritin checks with her primary care provider. -- Labs today show white blood cell count 6.3, hemoglobin 13.8, MCV 90.1, platelets 272 --Encouraged the patient to have her children undergo genetic testing. -- Recommend routine follow-up with primary care provider.  If ferritin were to steadily increase above 500 would recommend rereferral to our clinic.  No orders of the defined types were placed in this encounter.   All questions were answered. The patient knows to call the clinic with any problems, questions or concerns.  A total of more than 25 minutes were spent on this encounter with face-to-face time and non-face-to-face time, including preparing to see the patient, ordering tests and/or medications, counseling the patient and coordination of care as outlined above.   Norleen IVAR Kidney, MD Department of Hematology/Oncology [facility] Phone: [phone] Pager: [phone] Email: [email or web address]  11/02/2023 4:26 PM

## 2023-11-02 ENCOUNTER — Inpatient Hospital Stay (HOSPITAL_BASED_OUTPATIENT_CLINIC_OR_DEPARTMENT_OTHER): Payer: BC Managed Care – PPO | Admitting: Hematology and Oncology

## 2023-11-02 ENCOUNTER — Inpatient Hospital Stay: Payer: BC Managed Care – PPO | Attending: Internal Medicine

## 2023-11-02 ENCOUNTER — Other Ambulatory Visit: Payer: Self-pay | Admitting: Hematology and Oncology

## 2023-11-02 DIAGNOSIS — R7989 Other specified abnormal findings of blood chemistry: Secondary | ICD-10-CM | POA: Insufficient documentation

## 2023-11-02 DIAGNOSIS — E559 Vitamin D deficiency, unspecified: Secondary | ICD-10-CM | POA: Diagnosis not present

## 2023-11-02 DIAGNOSIS — R5383 Other fatigue: Secondary | ICD-10-CM | POA: Diagnosis not present

## 2023-11-02 DIAGNOSIS — E785 Hyperlipidemia, unspecified: Secondary | ICD-10-CM | POA: Diagnosis not present

## 2023-11-02 LAB — FERRITIN: Ferritin: 706 ng/mL — ABNORMAL HIGH (ref 11–307)

## 2023-11-02 LAB — RETIC PANEL
Immature Retic Fract: 5.8 % (ref 2.3–15.9)
RBC.: 4.23 MIL/uL (ref 3.87–5.11)
Retic Count, Absolute: 71.1 10*3/uL (ref 19.0–186.0)
Retic Ct Pct: 1.7 % (ref 0.4–3.1)
Reticulocyte Hemoglobin: 36.3 pg (ref >27.9)

## 2023-11-02 LAB — IRON AND IRON BINDING CAPACITY (CC-WL,HP ONLY)
Iron: 133 ug/dL (ref 28–170)
Saturation Ratios: 41 % — ABNORMAL HIGH (ref 10.4–31.8)
TIBC: 323 ug/dL (ref 250–450)
UIBC: 190 ug/dL (ref 148–442)

## 2023-11-02 LAB — CBC WITH DIFFERENTIAL (CANCER CENTER ONLY)
Abs Immature Granulocytes: 0.02 10*3/uL (ref 0.00–0.07)
Basophils Absolute: 0 10*3/uL (ref 0.0–0.1)
Basophils Relative: 0 %
Eosinophils Absolute: 0.1 10*3/uL (ref 0.0–0.5)
Eosinophils Relative: 1 %
HCT: 39.9 % (ref 36.0–46.0)
Hemoglobin: 13.8 g/dL (ref 12.0–15.0)
Immature Granulocytes: 0 %
Lymphocytes Relative: 24 %
Lymphs Abs: 1.5 10*3/uL (ref 0.7–4.0)
MCH: 31.2 pg (ref 26.0–34.0)
MCHC: 34.6 g/dL (ref 30.0–36.0)
MCV: 90.1 fL (ref 80.0–100.0)
Monocytes Absolute: 0.5 10*3/uL (ref 0.1–1.0)
Monocytes Relative: 8 %
Neutro Abs: 4.2 10*3/uL (ref 1.7–7.7)
Neutrophils Relative %: 67 %
Platelet Count: 272 10*3/uL (ref 150–400)
RBC: 4.43 MIL/uL (ref 3.87–5.11)
RDW: 12.2 % (ref 11.5–15.5)
WBC Count: 6.3 10*3/uL (ref 4.0–10.5)
nRBC: 0 % (ref 0.0–0.2)

## 2023-11-02 LAB — CMP (CANCER CENTER ONLY)
ALT: 57 U/L — ABNORMAL HIGH (ref 0–44)
AST: 49 U/L — ABNORMAL HIGH (ref 15–41)
Albumin: 4.4 g/dL (ref 3.5–5.0)
Alkaline Phosphatase: 74 U/L (ref 38–126)
Anion gap: 7 (ref 5–15)
BUN: 14 mg/dL (ref 8–23)
CO2: 26 mmol/L (ref 22–32)
Calcium: 9.6 mg/dL (ref 8.9–10.3)
Chloride: 95 mmol/L — ABNORMAL LOW (ref 98–111)
Creatinine: 0.77 mg/dL (ref 0.44–1.00)
GFR, Estimated: >60 mL/min (ref >60)
Glucose, Bld: 109 mg/dL — ABNORMAL HIGH (ref 70–99)
Potassium: 4.2 mmol/L (ref 3.5–5.1)
Sodium: 128 mmol/L — ABNORMAL LOW (ref 135–145)
Total Bilirubin: 0.7 mg/dL (ref 0.0–1.2)
Total Protein: 6.9 g/dL (ref 6.5–8.1)

## 2023-11-03 ENCOUNTER — Ambulatory Visit (INDEPENDENT_AMBULATORY_CARE_PROVIDER_SITE_OTHER): Payer: BC Managed Care – PPO | Admitting: Orthopedic Surgery

## 2023-11-03 DIAGNOSIS — M6788 Other specified disorders of synovium and tendon, other site: Secondary | ICD-10-CM

## 2023-11-04 DIAGNOSIS — D0359 Melanoma in situ of other part of trunk: Secondary | ICD-10-CM | POA: Diagnosis not present

## 2023-11-05 DIAGNOSIS — Z789 Other specified health status: Secondary | ICD-10-CM | POA: Diagnosis not present

## 2023-11-05 DIAGNOSIS — E782 Mixed hyperlipidemia: Secondary | ICD-10-CM | POA: Diagnosis not present

## 2023-11-05 DIAGNOSIS — E559 Vitamin D deficiency, unspecified: Secondary | ICD-10-CM | POA: Diagnosis not present

## 2023-11-05 DIAGNOSIS — I10 Essential (primary) hypertension: Secondary | ICD-10-CM | POA: Diagnosis not present

## 2023-11-09 ENCOUNTER — Encounter: Payer: Self-pay | Admitting: Sports Medicine

## 2023-11-09 ENCOUNTER — Encounter: Payer: Self-pay | Admitting: Hematology and Oncology

## 2023-11-09 ENCOUNTER — Encounter: Payer: Self-pay | Admitting: Orthopedic Surgery

## 2023-11-09 NOTE — Progress Notes (Signed)
 Office Visit Note   Patient: Barbara Glenn           Date of Birth: 1961-06-16           MRN: [id number] Visit Date: 11/03/2023              Requested by: No referring provider defined for this encounter. PCP: Pcp, No  Chief Complaint  Patient presents with   Right Foot - Follow-up      HPI: Patient is a 63 year old woman who is seen for evaluation for insertional Achilles tendinitis on the right.  Patient has undergone shockwave therapy with Dr. Burnetta.  She has worked with therapy and using nonsteroidals.  Patient states that she is getting results with a TENS unit.  She states she is feeling better walking about 1 mile a day.  Assessment & Plan: Visit Diagnoses:  1. Achilles tendinosis of right lower extremity     Plan: Recommended that she try the PRP injection with Dr. Burnetta.  Continue with her strength and exercise training.  Follow-Up Instructions: Return if symptoms worsen or fail to improve.   Ortho Exam  Patient is alert, oriented, no adenopathy, well-dressed, normal affect, normal respiratory effort. Examination patient has a palpable pulse.  She is not tender to palpation along the Achilles or the retro-Achilles area.  She has good range of motion of her ankle.  Imaging: No results found. No images are attached to the encounter.  Labs: Lab Results  Component Value Date   ESRSEDRATE 4 04/28/2023   CRP 0.5 04/28/2023     Lab Results  Component Value Date   ALBUMIN 4.4 11/02/2023   ALBUMIN 4.0 04/28/2023    No results found for: MG No results found for: VD25OH  No results found for: PREALBUMIN    Latest Ref Rng & Units 11/02/2023    1:48 PM 11/02/2023    1:47 PM 04/28/2023   10:21 AM  CBC EXTENDED  WBC 4.0 - 10.5 K/uL 6.3   4.9   RBC 3.87 - 5.11 MIL/uL 4.43  4.23  3.88   Hemoglobin 12.0 - 15.0 g/dL 86.1   87.3   HCT 63.9 - 46.0 % 39.9   36.5   Platelets 150 - 400 K/uL 272   254   NEUT# 1.7 - 7.7 K/uL 4.2   3.0   Lymph# 0.7 - 4.0 K/uL 1.5    1.4      There is no height or weight on file to calculate BMI.  Orders:  No orders of the defined types were placed in this encounter.  No orders of the defined types were placed in this encounter.    Procedures: No procedures performed  Clinical Data: No additional findings.  ROS:  All other systems negative, except as noted in the HPI. Review of Systems  Objective: Vital Signs: There were no vitals taken for this visit.  Specialty Comments:  No specialty comments available.  PMFS History: There are no active problems to display for this patient.  Past Medical History:  Diagnosis Date   Hyperlipidemia    Hypertension     History reviewed. No pertinent family history.  Past Surgical History:  Procedure Laterality Date   DILATION AND CURETTAGE OF UTERUS     ENDOMETRIAL ABLATION     PLACEMENT OF BREAST IMPLANTS     Social History   Occupational History   Not on file  Tobacco Use   Smoking status: Never   Smokeless tobacco: Never  Substance  and Sexual Activity   Alcohol use: Yes    Alcohol/week: 6.0 standard drinks of alcohol    Types: 6 Cans of beer per week   Drug use: Never   Sexual activity: Not on file

## 2023-11-10 DIAGNOSIS — J301 Allergic rhinitis due to pollen: Secondary | ICD-10-CM | POA: Diagnosis not present

## 2023-11-10 DIAGNOSIS — J3089 Other allergic rhinitis: Secondary | ICD-10-CM | POA: Diagnosis not present

## 2023-11-10 DIAGNOSIS — J3081 Allergic rhinitis due to animal (cat) (dog) hair and dander: Secondary | ICD-10-CM | POA: Diagnosis not present

## 2023-11-11 DIAGNOSIS — R400 Somnolence: Secondary | ICD-10-CM | POA: Diagnosis not present

## 2023-11-11 DIAGNOSIS — E669 Obesity, unspecified: Secondary | ICD-10-CM | POA: Diagnosis not present

## 2023-11-11 DIAGNOSIS — Z9189 Other specified personal risk factors, not elsewhere classified: Secondary | ICD-10-CM | POA: Diagnosis not present

## 2023-11-11 DIAGNOSIS — R0683 Snoring: Secondary | ICD-10-CM | POA: Diagnosis not present

## 2023-11-13 ENCOUNTER — Telehealth: Payer: Self-pay | Admitting: Sports Medicine

## 2023-11-13 NOTE — Telephone Encounter (Signed)
Called patient left message to return call to reschedule her appointment per her mychart request     Patient said she is out of town

## 2023-11-15 ENCOUNTER — Encounter: Payer: Self-pay | Admitting: Sports Medicine

## 2023-11-16 ENCOUNTER — Encounter: Payer: Self-pay | Admitting: Sports Medicine

## 2023-11-16 ENCOUNTER — Other Ambulatory Visit: Payer: Self-pay

## 2023-11-16 ENCOUNTER — Ambulatory Visit: Payer: BC Managed Care – PPO | Admitting: Sports Medicine

## 2023-11-16 DIAGNOSIS — M6788 Other specified disorders of synovium and tendon, other site: Secondary | ICD-10-CM

## 2023-11-16 DIAGNOSIS — M7731 Calcaneal spur, right foot: Secondary | ICD-10-CM

## 2023-11-16 MED ORDER — HYDROCODONE-ACETAMINOPHEN 5-325 MG PO TABS: 1.0000 | ORAL_TABLET | Freq: Four times a day (QID) | ORAL | 0 refills | Status: AC | PRN

## 2023-11-16 NOTE — Progress Notes (Addendum)
   Procedure Note  Patient: Barbara Glenn             Date of Birth: [birth year]           MRN: [id number]             Visit Date: 11/16/2023  Procedures: Visit Diagnoses:  1. Achilles tendinosis of right lower extremity   2. Calcaneal spur of right foot        Procedure: Achilles Tendon PRP Injection, Right Leg After informed verbal consent was obtained, a timeout was performed. Patient was lying prone on examination table with the foot in a neutral position.  The overlying area was prepped with ChloraPrep and multiple alcohol swabs.  After identification of the appropriate Achilles site, the overlying soft tissue only was anesthetized with 2 cc of lidocaine 1% without entry into the tendon itself.  Then, using ultrasound guidance via an in-plane approach a 25-gauge, 1.5 inch needle was inserted from a proximal to distal direction into the distal Achilles tendon with multiple needle fenestrations and injected with 4 cc of platelet rich plasma (leukocyte-rich).  The needle was then removed and inserted from a lateral position again in a short-axis plane with in-plane approach with multiple needle fenestrations and injectate of 2 cc of the remainder of platelet rich plasma (leukocyte-rich) both intratendinous and peritendinously.  Visualization of injectate spread within the Achilles tendon was visualized dynamically under ultrasound guidance. Patient tolerated the procedure well without immediate complications  Kit: RegenLab: RegenPlasma; THT-3 kit   *Post-PRP Injection Guidelines: No anti-inflammatories (ibuprofen/motrin, aleve, meloxicam, etc.) for 2 weeks.  No ice for 2 weeks.  Short prescription of tramadol may be written if pain is severe, call if needed. Appropriate rest / bracing / and timeframe for beginning therapy discussed and patient endorsed understanding. Patient has a CAM walker boot which she wore out of her appointment today.  - did send in short course of Norco 5-325mg  to use  only as needed - begin PT/HEP in 2 weeks  Madelyn Brunner, DO Primary Care Sports Medicine Physician  [facility] [place] [facility]  This note was dictated using Dragon naturally speaking software and may contain errors in syntax, spelling, or content which have not been identified prior to signing this note.

## 2023-11-23 ENCOUNTER — Ambulatory Visit: Payer: BC Managed Care – PPO | Admitting: Sports Medicine

## 2023-11-30 ENCOUNTER — Other Ambulatory Visit: Payer: Self-pay | Admitting: Sports Medicine

## 2023-11-30 DIAGNOSIS — M6788 Other specified disorders of synovium and tendon, other site: Secondary | ICD-10-CM

## 2023-11-30 NOTE — Telephone Encounter (Signed)
 PT referral sent

## 2023-12-04 DIAGNOSIS — J301 Allergic rhinitis due to pollen: Secondary | ICD-10-CM | POA: Diagnosis not present

## 2023-12-04 DIAGNOSIS — J3081 Allergic rhinitis due to animal (cat) (dog) hair and dander: Secondary | ICD-10-CM | POA: Diagnosis not present

## 2023-12-04 DIAGNOSIS — J3089 Other allergic rhinitis: Secondary | ICD-10-CM | POA: Diagnosis not present

## 2023-12-09 DIAGNOSIS — F411 Generalized anxiety disorder: Secondary | ICD-10-CM | POA: Diagnosis not present

## 2023-12-09 DIAGNOSIS — F331 Major depressive disorder, recurrent, moderate: Secondary | ICD-10-CM | POA: Diagnosis not present

## 2023-12-09 DIAGNOSIS — G4700 Insomnia, unspecified: Secondary | ICD-10-CM | POA: Diagnosis not present

## 2023-12-11 DIAGNOSIS — J3081 Allergic rhinitis due to animal (cat) (dog) hair and dander: Secondary | ICD-10-CM | POA: Diagnosis not present

## 2023-12-11 DIAGNOSIS — J301 Allergic rhinitis due to pollen: Secondary | ICD-10-CM | POA: Diagnosis not present

## 2023-12-11 DIAGNOSIS — J3089 Other allergic rhinitis: Secondary | ICD-10-CM | POA: Diagnosis not present

## 2023-12-15 ENCOUNTER — Ambulatory Visit (INDEPENDENT_AMBULATORY_CARE_PROVIDER_SITE_OTHER): Payer: BC Managed Care – PPO | Admitting: Physical Therapy

## 2023-12-15 ENCOUNTER — Encounter: Payer: Self-pay | Admitting: Physical Therapy

## 2023-12-15 DIAGNOSIS — R29898 Other symptoms and signs involving the musculoskeletal system: Secondary | ICD-10-CM | POA: Diagnosis not present

## 2023-12-15 DIAGNOSIS — M25571 Pain in right ankle and joints of right foot: Secondary | ICD-10-CM

## 2023-12-15 NOTE — Therapy (Signed)
OUTPATIENT PHYSICAL THERAPY EVALUATION   Patient Name: Barbara Glenn MRN: [id number] DOB:1961/03/15, 63 y.o., female Today's Date: 12/15/2023  END OF SESSION:  PT End of Session - 12/15/23 0814     Visit Number 1    Number of Visits 6    Date for PT Re-Evaluation 01/26/24    Authorization Type [employer] 30% coinsurance; 30 visit limit    Progress Note Due on Visit 10    PT Start Time 0812    PT Stop Time 0835    PT Time Calculation (min) 23 min    Activity Tolerance Patient tolerated treatment well                     Past Medical History:  Diagnosis Date   Hyperlipidemia    Hypertension    Past Surgical History:  Procedure Laterality Date   DILATION AND CURETTAGE OF UTERUS     ENDOMETRIAL ABLATION     PLACEMENT OF BREAST IMPLANTS     There are no active problems to display for this patient.   PCP: Pcp, No  REFERRING PROVIDER: Madelyn Brunner, DO  REFERRING DIAG: [id number] (ICD-10-CM) - Achilles tendinosis of right lower extremity  Rationale for Evaluation and Treatment: Rehabilitation  THERAPY DIAG:  Pain in right ankle and joints of right foot - Plan: PT plan of care cert/re-cert  Other symptoms and signs involving the musculoskeletal system - Plan: PT plan of care cert/re-cert  ONSET DATE: Sept 2023   SUBJECTIVE:                                                                                                                                                                                           SUBJECTIVE STATEMENT: Reports pain is still present and she's doing some band exercises.Unable to perform a single leg calf raise.  She had PRP injection on 11/16/23.    PERTINENT HISTORY:  HLD, HTN  PAIN:  Are you having pain? Yes: NPRS scale: 5, up to 8, at best 5/10 Pain location: Rt heel/achilles Pain description: stretching, pulling Aggravating factors: [place] worse in [place] Relieving factors: rest, shockwave  therapy  PRECAUTIONS:  None  RED FLAGS: None   WEIGHT BEARING RESTRICTIONS:  No  FALLS:  Has patient fallen in last 6 months? 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 Moderate tendinosis of the Achilles tendon without a tear and with subcortical bone marrow edema at the Achilles insertion. Mild retrocalcaneal bursitis. 2. Moderate tendinosis of the peroneus brevis with a small partial-thickness tear just distal to the lateral malleolus.  PATIENT SURVEYS:  12/15/23 Patient-specific activity scoring scheme   "0" represents "unable to perform." "10" represents "able to perform at prior level. 0 1 2 3 4 5 6 7 8 9  10 (Date and Score) Activity Initial  Activity Eval     Walking  4    2.         3.       4.    5.     Total score = sum of the activity scores/number of activities Minimum detectable change (90%CI) for average score = 2 points Minimum detectable change (90%CI) for single activity score = 3 points   Score: 4   COGNITIVE STATUS: Within functional limits for tasks assessed   SENSATION: WFL  POSTURE:  rounded shoulders and forward head  GAIT: 06/19/23 Comments: mild antalgic gait   PALPATION: Trigger points noted in Rt soleus  LOWER EXTREMITY ROM:     ROM Right 06/19/23 Right 12/15/23  Ankle dorsiflexion A: 5 P:10 A: 8  Ankle plantarflexion A: 59 A: 51  Ankle inversion A: 22 A: 22  Ankle eversion A: 13 A: 14   (Blank rows = not tested)   LOWER EXTREMITY MMT:    MMT Right 06/19/23 Right 12/15/23  Ankle dorsiflexion 5/5 5/5  Ankle  plantarflexion 4/5 2/5  Ankle inversion 5/5 5/5  Ankle eversion 4/5 5/5   (Blank rows = not tested)    FUNCTIONAL TESTS:  06/19/23:  SLS: RLE: 5 sec; LLE: 7 sec 12/15/23: SLS: Rt: 5 sec; Lt: 3 sec   TREATMENT:                                                                                                                              DATE:  12/15/23 See HEP - reviewed prior HEP and trial reps of new exercises performed, min cues for technique    PATIENT EDUCATION:  Education details: HEP Person educated: Patient Education method: Explanation, Demonstration, and Handouts Education comprehension: verbalized understanding, returned demonstration, and needs further education  HOME EXERCISE PROGRAM: Access Code: [email or web address] URL: [email or web address] Date: 12/15/2023 Prepared by: Moshe Cipro  Exercises - Seated Ankle Eversion with Resistance  - 1-2 x daily - 7 x weekly - 1 sets - 20 reps - Ankle and Toe Plantarflexion with Resistance  - 1-2 x daily - 7 x weekly - 1 sets - 20 reps - Standing Eccentric Heel Raise  - 1-2 x daily - 7 x weekly - 1-2 sets - 10 reps - Standing Heel Raise with Band  - 1-2 x daily - 7 x  weekly - 1-2 sets - 10 reps - Gastroc Stretch on Wall  - 2 x daily - 7 x weekly - 1 sets - 3 reps - 30 sec hold - Soleus Stretch on Wall  - 2 x daily - 7 x weekly - 1 sets - 3 reps - 30 sec hold - Single Leg Stance with Support  - 1 x daily - 7 x weekly - 1 sets - 5 reps - 10- 15 sec hold - Single Leg Stance on Foam Pad  - 1 x daily - 7 x weekly - 1 sets - 5 reps - 10-15 hold   ASSESSMENT:  CLINICAL IMPRESSION: Patient is a 63 y.o. female who was seen today for physical therapy evaluation and treatment for continued Rt heel pain. She demonstrates mild ROM limitations and decreased strength with continued pain affecting functional mobility.  She will benefit from PT to address deficits listed.    OBJECTIVE IMPAIRMENTS: Abnormal gait, decreased balance,  decreased mobility, difficulty walking, decreased strength, increased fascial restrictions, increased muscle spasms, and pain.   ACTIVITY LIMITATIONS: carrying, lifting, squatting, stairs, and locomotion level  PARTICIPATION LIMITATIONS: meal prep, cleaning, laundry, driving, shopping, and community activity  PERSONAL FACTORS: 1-2 comorbidities: HTN, HLD  are also affecting patient's functional outcome.   REHAB POTENTIAL: Good  CLINICAL DECISION MAKING: Evolving/moderate complexity  EVALUATION COMPLEXITY: Moderate   GOALS: Goals reviewed with patient? Yes  SHORT TERM GOALS: Target date: 01/05/2024  Independent with initial HEP Goal status: INITIAL 12/15/23   LONG TERM GOALS: Target date: 01/26/2024  Independent with final HEP Goal status: INITIAL 12/15/23  2.  FOTO score improved to 66 Goal status: INITIAL 12/15/23  3.  Rt ankle strength improved to 5/5 for improved function and mobility Goal status: INITIAL 12/15/23  4.  Report pain < 3/10 with stairs and walking inclines for improved function Goal status: INITIAL 12/15/23  5.  Perform bil SLS to at least 10 sec for improved stability and balance Goal status: INITIAL 12/15/23    PLAN:  PT FREQUENCY: 1x/week, plan to see PRN, will see up to 1x/wk if needed  PT DURATION: 6 weeks  PLANNED INTERVENTIONS: Therapeutic exercises, Therapeutic activity, Neuromuscular re-education, Balance training, Gait training, Patient/Family education, Self Care, Joint mobilization, Joint manipulation, Stair training, Vestibular training, Canalith repositioning, Aquatic Therapy, Dry Needling, Electrical stimulation, Cryotherapy, Moist heat, Taping, Vasopneumatic device, Ultrasound, Ionotophoresis 4mg /ml Dexamethasone, Manual therapy, Re-evaluation, and iontophoresis with acetic acid .  PLAN FOR NEXT SESSION: review/progress HEP, manual/modalities/DN PRN   NEXT MD VISIT: PRN  Clarita Crane, PT, DPT 12/15/23 8:42 AM

## 2023-12-16 ENCOUNTER — Ambulatory Visit: Payer: BC Managed Care – PPO | Admitting: Physical Therapy

## 2023-12-22 DIAGNOSIS — G4700 Insomnia, unspecified: Secondary | ICD-10-CM | POA: Diagnosis not present

## 2023-12-22 DIAGNOSIS — F4001 Agoraphobia with panic disorder: Secondary | ICD-10-CM | POA: Diagnosis not present

## 2023-12-22 DIAGNOSIS — F331 Major depressive disorder, recurrent, moderate: Secondary | ICD-10-CM | POA: Diagnosis not present

## 2023-12-22 DIAGNOSIS — F411 Generalized anxiety disorder: Secondary | ICD-10-CM | POA: Diagnosis not present

## 2023-12-23 DIAGNOSIS — J301 Allergic rhinitis due to pollen: Secondary | ICD-10-CM | POA: Diagnosis not present

## 2023-12-23 DIAGNOSIS — J3089 Other allergic rhinitis: Secondary | ICD-10-CM | POA: Diagnosis not present

## 2023-12-23 DIAGNOSIS — J3081 Allergic rhinitis due to animal (cat) (dog) hair and dander: Secondary | ICD-10-CM | POA: Diagnosis not present

## 2023-12-28 DIAGNOSIS — U071 COVID-19: Secondary | ICD-10-CM | POA: Diagnosis not present

## 2023-12-28 DIAGNOSIS — G4730 Sleep apnea, unspecified: Secondary | ICD-10-CM | POA: Diagnosis not present

## 2023-12-28 DIAGNOSIS — J209 Acute bronchitis, unspecified: Secondary | ICD-10-CM | POA: Diagnosis not present

## 2024-01-05 DIAGNOSIS — F331 Major depressive disorder, recurrent, moderate: Secondary | ICD-10-CM | POA: Diagnosis not present

## 2024-01-05 DIAGNOSIS — F411 Generalized anxiety disorder: Secondary | ICD-10-CM | POA: Diagnosis not present

## 2024-01-05 DIAGNOSIS — G4733 Obstructive sleep apnea (adult) (pediatric): Secondary | ICD-10-CM | POA: Diagnosis not present

## 2024-01-05 DIAGNOSIS — G4700 Insomnia, unspecified: Secondary | ICD-10-CM | POA: Diagnosis not present

## 2024-01-08 ENCOUNTER — Encounter: Payer: BC Managed Care – PPO | Admitting: Physical Therapy

## 2024-01-13 ENCOUNTER — Other Ambulatory Visit: Payer: Self-pay | Admitting: Surgical

## 2024-01-18 DIAGNOSIS — D649 Anemia, unspecified: Secondary | ICD-10-CM | POA: Diagnosis not present

## 2024-01-18 DIAGNOSIS — E785 Hyperlipidemia, unspecified: Secondary | ICD-10-CM | POA: Diagnosis not present

## 2024-01-21 DIAGNOSIS — D225 Melanocytic nevi of trunk: Secondary | ICD-10-CM | POA: Diagnosis not present

## 2024-01-21 DIAGNOSIS — F331 Major depressive disorder, recurrent, moderate: Secondary | ICD-10-CM | POA: Diagnosis not present

## 2024-01-21 DIAGNOSIS — L812 Freckles: Secondary | ICD-10-CM | POA: Diagnosis not present

## 2024-01-21 DIAGNOSIS — Z85820 Personal history of malignant melanoma of skin: Secondary | ICD-10-CM | POA: Diagnosis not present

## 2024-01-21 DIAGNOSIS — L821 Other seborrheic keratosis: Secondary | ICD-10-CM | POA: Diagnosis not present

## 2024-01-21 DIAGNOSIS — F411 Generalized anxiety disorder: Secondary | ICD-10-CM | POA: Diagnosis not present

## 2024-01-21 DIAGNOSIS — G4700 Insomnia, unspecified: Secondary | ICD-10-CM | POA: Diagnosis not present

## 2024-01-22 ENCOUNTER — Encounter: Payer: Self-pay | Admitting: Physical Therapy

## 2024-01-22 ENCOUNTER — Ambulatory Visit (INDEPENDENT_AMBULATORY_CARE_PROVIDER_SITE_OTHER): Admitting: Physical Therapy

## 2024-01-22 DIAGNOSIS — J301 Allergic rhinitis due to pollen: Secondary | ICD-10-CM | POA: Diagnosis not present

## 2024-01-22 DIAGNOSIS — R29898 Other symptoms and signs involving the musculoskeletal system: Secondary | ICD-10-CM

## 2024-01-22 DIAGNOSIS — J3089 Other allergic rhinitis: Secondary | ICD-10-CM | POA: Diagnosis not present

## 2024-01-22 DIAGNOSIS — J3081 Allergic rhinitis due to animal (cat) (dog) hair and dander: Secondary | ICD-10-CM | POA: Diagnosis not present

## 2024-01-22 DIAGNOSIS — M25571 Pain in right ankle and joints of right foot: Secondary | ICD-10-CM | POA: Diagnosis not present

## 2024-01-22 NOTE — Therapy (Signed)
 OUTPATIENT PHYSICAL THERAPY TREATMENT   Patient Name: Barbara Glenn MRN: [id number] DOB:[birth year], 63 y.o., female Today's Date: 01/22/2024  END OF SESSION:  PT End of Session - 01/22/24 1012     Visit Number 2    Number of Visits 6    Date for PT Re-Evaluation 01/26/24    Authorization Type [employer] 30% coinsurance; 30 visit limit    Progress Note Due on Visit 10    PT Start Time 1014    PT Stop Time 1047    PT Time Calculation (min) 33 min    Activity Tolerance Patient tolerated treatment well                      Past Medical History:  Diagnosis Date   Hyperlipidemia    Hypertension    Past Surgical History:  Procedure Laterality Date   DILATION AND CURETTAGE OF UTERUS     ENDOMETRIAL ABLATION     PLACEMENT OF BREAST IMPLANTS     There are no active problems to display for this patient.   PCP: Pcp, No  REFERRING PROVIDER: Madelyn Brunner, DO  REFERRING DIAG: [phone] (ICD-10-CM) - Achilles tendinosis of right lower extremity  Rationale for Evaluation and Treatment: Rehabilitation  THERAPY DIAG:  Pain in right ankle and joints of right foot  Other symptoms and signs involving the musculoskeletal system  ONSET DATE: Sept 2023   SUBJECTIVE:                                                                                                                                                                                           SUBJECTIVE STATEMENT: Has been really sick but feeling better now.  Hasn't been able to do exercises very much. Pain is still there with walking initially but gets better with activity   PERTINENT HISTORY:  HLD, HTN  PAIN:  Are you having pain? Yes: NPRS scale: 5, up to 8, at best 5/10 Pain location: Rt heel/achilles Pain description: stretching, pulling Aggravating factors: [place]; worse in [place] Relieving factors: rest, shockwave therapy  PRECAUTIONS:  None  RED FLAGS: None   WEIGHT  BEARING RESTRICTIONS:  No  FALLS:  Has patient fallen in last 6 months? 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 Moderate tendinosis of the Achilles tendon without a tear and with subcortical bone marrow edema at the Achilles insertion. Mild retrocalcaneal bursitis. 2. Moderate tendinosis of the peroneus brevis with a small partial-thickness tear just distal to the lateral malleolus.  PATIENT SURVEYS:  12/15/23 Patient-specific activity scoring scheme   "0" represents "unable to perform." "10" represents "able to perform at prior level. 0 1 2 3 4 5 6 7 8 9  10 (Date and Score) Activity Initial  Activity Eval     Walking  4    Score 4    Total score = sum of the activity scores/number of activities Minimum detectable change (90%CI) for average score = 2 points Minimum detectable change (90%CI) for single activity score = 3 points   Score: 4   COGNITIVE STATUS: Within functional limits for tasks assessed   SENSATION: WFL  POSTURE:  rounded shoulders and forward head  GAIT: 06/19/23 Comments: mild antalgic gait   PALPATION: Trigger points noted in Rt soleus  LOWER EXTREMITY ROM:     ROM Right 06/19/23 Right 12/15/23  Ankle dorsiflexion A: 5 P:10 A: 8  Ankle plantarflexion A: 59 A: 51  Ankle inversion A: 22 A: 22  Ankle eversion A: 13 A: 14   (Blank rows = not tested)   LOWER EXTREMITY MMT:    MMT Right 06/19/23 Right 12/15/23  Ankle dorsiflexion 5/5 5/5  Ankle plantarflexion 4/5 2/5  Ankle inversion 5/5 5/5  Ankle eversion 4/5 5/5   (Blank  rows = not tested)    FUNCTIONAL TESTS:  06/19/23:  SLS: RLE: 5 sec; LLE: 7 sec 12/15/23: SLS: Rt: 5 sec; Lt: 3 sec   TREATMENT:                                                                                                                              DATE:  01/22/24 TherEx Review/modifications to HEP  with pt verbalizing understanding Added additional exercises for pt with pt demonstrating return understanding including lateral hopping, bouncing/jumping based on tolerance, and sustained calf hold on RLE x 15 sec.  Self Care Review of acetic acid iontophoresis protocol with pt and to try again if interested  12/15/23 See HEP - reviewed prior HEP and trial reps of new exercises performed, min cues for technique    PATIENT EDUCATION:  Education details: HEP Person educated: Patient Education method: Explanation, Demonstration, and Handouts Education comprehension: verbalized understanding, returned demonstration, and needs further education  HOME EXERCISE PROGRAM: Access Code: [email or web address] URL: [email or web address] Date: 01/22/2024 Prepared by: Moshe Cipro  Exercises - Seated Ankle Eversion with Resistance  - 1-2 x daily - 7 x weekly - 1 sets - 20 reps - Ankle and Toe Plantarflexion with Resistance  - 1-2 x daily - 7 x weekly - 1 sets - 20 reps - Standing Eccentric Heel Raise  - 1-2 x daily -  7 x weekly - 1-2 sets - 10 reps - Standing Heel Raise with Band  - 1-2 x daily - 7 x weekly - 1-2 sets - 10 reps - Gastroc Stretch on Wall  - 2 x daily - 7 x weekly - 1 sets - 3 reps - 30 sec hold - Soleus Stretch on Wall  - 2 x daily - 7 x weekly - 1 sets - 3 reps - 30 sec hold - Single Leg Stance with Support  - 1 x daily - 7 x weekly - 1 sets - 5 reps - 10- 15 sec hold - Single Leg Stance on Foam Pad  - 1 x daily - 7 x weekly - 1 sets - 5 reps - 10-15 hold - Lateral Hopping on Level Ground  - 1 x daily - 7 x weekly - 1 sets - 5 reps - 15 seconds - Jumping Rope  - 1 x  daily - 7 x weekly - 3 sets - 10 reps - Isometric Heel Raise at Wall  - 1 x daily - 7 x weekly - 3 sets - 10 reps - 10-15 sec hold   ASSESSMENT:  CLINICAL IMPRESSION: Pt with limited ability to work on HEP as she reports recent illness taking several weeks to recover.  Did modify HEP today and discussed her concerns/questions with her.  Plan to follow up in a few weeks after she is able to be more consistent with HEP.     OBJECTIVE IMPAIRMENTS: Abnormal gait, decreased balance, decreased mobility, difficulty walking, decreased strength, increased fascial restrictions, increased muscle spasms, and pain.   ACTIVITY LIMITATIONS: carrying, lifting, squatting, stairs, and locomotion level  PARTICIPATION LIMITATIONS: meal prep, cleaning, laundry, driving, shopping, and community activity  PERSONAL FACTORS: 1-2 comorbidities: HTN, HLD  are also affecting patient's functional outcome.   REHAB POTENTIAL: Good  CLINICAL DECISION MAKING: Evolving/moderate complexity  EVALUATION COMPLEXITY: Moderate   GOALS: Goals reviewed with patient? Yes  SHORT TERM GOALS: Target date: 01/05/2024  Independent with initial HEP Goal status: INITIAL 12/15/23   LONG TERM GOALS: Target date: 01/26/2024  Independent with final HEP Goal status: INITIAL 12/15/23  2.  FOTO score improved to 66 Goal status: INITIAL 12/15/23  3.  Rt ankle strength improved to 5/5 for improved function and mobility Goal status: INITIAL 12/15/23  4.  Report pain < 3/10 with stairs and walking inclines for improved function Goal status: INITIAL 12/15/23  5.  Perform bil SLS to at least 10 sec for improved stability and balance Goal status: INITIAL 12/15/23    PLAN:  PT FREQUENCY: 1x/week, plan to see PRN, will see up to 1x/wk if needed  PT DURATION: 6 weeks  PLANNED INTERVENTIONS: Therapeutic exercises, Therapeutic activity, Neuromuscular re-education, Balance training, Gait training, Patient/Family education, Self Care,  Joint mobilization, Joint manipulation, Stair training, Vestibular training, Canalith repositioning, Aquatic Therapy, Dry Needling, Electrical stimulation, Cryotherapy, Moist heat, Taping, Vasopneumatic device, Ultrasound, Ionotophoresis 4mg /ml Dexamethasone, Manual therapy, Re-evaluation, and iontophoresis with acetic acid .  PLAN FOR NEXT SESSION:  review HEP, manual/modalities/DN PRN   NEXT MD VISIT: PRN  Clarita Crane, PT, DPT 01/22/24 10:54 AM

## 2024-01-28 DIAGNOSIS — G4733 Obstructive sleep apnea (adult) (pediatric): Secondary | ICD-10-CM | POA: Diagnosis not present

## 2024-01-28 DIAGNOSIS — J301 Allergic rhinitis due to pollen: Secondary | ICD-10-CM | POA: Diagnosis not present

## 2024-01-28 DIAGNOSIS — J3081 Allergic rhinitis due to animal (cat) (dog) hair and dander: Secondary | ICD-10-CM | POA: Diagnosis not present

## 2024-01-28 DIAGNOSIS — J3089 Other allergic rhinitis: Secondary | ICD-10-CM | POA: Diagnosis not present

## 2024-02-04 DIAGNOSIS — F331 Major depressive disorder, recurrent, moderate: Secondary | ICD-10-CM | POA: Diagnosis not present

## 2024-02-04 DIAGNOSIS — G4700 Insomnia, unspecified: Secondary | ICD-10-CM | POA: Diagnosis not present

## 2024-02-04 DIAGNOSIS — E782 Mixed hyperlipidemia: Secondary | ICD-10-CM | POA: Diagnosis not present

## 2024-02-04 DIAGNOSIS — F411 Generalized anxiety disorder: Secondary | ICD-10-CM | POA: Diagnosis not present

## 2024-02-05 DIAGNOSIS — J3089 Other allergic rhinitis: Secondary | ICD-10-CM | POA: Diagnosis not present

## 2024-02-15 ENCOUNTER — Encounter: Admitting: Physical Therapy

## 2024-02-17 DIAGNOSIS — J3081 Allergic rhinitis due to animal (cat) (dog) hair and dander: Secondary | ICD-10-CM | POA: Diagnosis not present

## 2024-02-17 DIAGNOSIS — J3089 Other allergic rhinitis: Secondary | ICD-10-CM | POA: Diagnosis not present

## 2024-02-19 ENCOUNTER — Encounter: Payer: Self-pay | Admitting: Family Medicine

## 2024-02-19 DIAGNOSIS — E782 Mixed hyperlipidemia: Secondary | ICD-10-CM | POA: Diagnosis not present

## 2024-02-19 DIAGNOSIS — I10 Essential (primary) hypertension: Secondary | ICD-10-CM | POA: Diagnosis not present

## 2024-02-19 DIAGNOSIS — G4733 Obstructive sleep apnea (adult) (pediatric): Secondary | ICD-10-CM | POA: Diagnosis not present

## 2024-02-19 DIAGNOSIS — Z789 Other specified health status: Secondary | ICD-10-CM | POA: Diagnosis not present

## 2024-02-25 DIAGNOSIS — J3089 Other allergic rhinitis: Secondary | ICD-10-CM | POA: Diagnosis not present

## 2024-02-27 DIAGNOSIS — G4733 Obstructive sleep apnea (adult) (pediatric): Secondary | ICD-10-CM | POA: Diagnosis not present

## 2024-03-07 DIAGNOSIS — F331 Major depressive disorder, recurrent, moderate: Secondary | ICD-10-CM | POA: Diagnosis not present

## 2024-03-07 DIAGNOSIS — G4700 Insomnia, unspecified: Secondary | ICD-10-CM | POA: Diagnosis not present

## 2024-03-07 DIAGNOSIS — I10 Essential (primary) hypertension: Secondary | ICD-10-CM | POA: Diagnosis not present

## 2024-03-07 DIAGNOSIS — F411 Generalized anxiety disorder: Secondary | ICD-10-CM | POA: Diagnosis not present

## 2024-03-08 ENCOUNTER — Encounter: Admitting: Physical Therapy

## 2024-03-09 DIAGNOSIS — J301 Allergic rhinitis due to pollen: Secondary | ICD-10-CM | POA: Diagnosis not present

## 2024-03-09 DIAGNOSIS — J3081 Allergic rhinitis due to animal (cat) (dog) hair and dander: Secondary | ICD-10-CM | POA: Diagnosis not present

## 2024-03-09 DIAGNOSIS — J3089 Other allergic rhinitis: Secondary | ICD-10-CM | POA: Diagnosis not present

## 2024-03-23 ENCOUNTER — Encounter: Admitting: Physical Therapy

## 2024-03-25 DIAGNOSIS — E663 Overweight: Secondary | ICD-10-CM | POA: Diagnosis not present

## 2024-03-25 DIAGNOSIS — Z79899 Other long term (current) drug therapy: Secondary | ICD-10-CM | POA: Diagnosis not present

## 2024-03-25 DIAGNOSIS — G4733 Obstructive sleep apnea (adult) (pediatric): Secondary | ICD-10-CM | POA: Diagnosis not present

## 2024-03-25 DIAGNOSIS — G4731 Primary central sleep apnea: Secondary | ICD-10-CM | POA: Diagnosis not present

## 2024-03-25 DIAGNOSIS — J3089 Other allergic rhinitis: Secondary | ICD-10-CM | POA: Diagnosis not present

## 2024-03-28 DIAGNOSIS — G4733 Obstructive sleep apnea (adult) (pediatric): Secondary | ICD-10-CM | POA: Diagnosis not present

## 2024-03-29 DIAGNOSIS — G4733 Obstructive sleep apnea (adult) (pediatric): Secondary | ICD-10-CM | POA: Diagnosis not present

## 2024-03-31 DIAGNOSIS — J3089 Other allergic rhinitis: Secondary | ICD-10-CM | POA: Diagnosis not present

## 2024-03-31 DIAGNOSIS — J3081 Allergic rhinitis due to animal (cat) (dog) hair and dander: Secondary | ICD-10-CM | POA: Diagnosis not present

## 2024-03-31 DIAGNOSIS — J301 Allergic rhinitis due to pollen: Secondary | ICD-10-CM | POA: Diagnosis not present

## 2024-04-04 ENCOUNTER — Encounter: Payer: Self-pay | Admitting: Physical Therapy

## 2024-04-04 ENCOUNTER — Ambulatory Visit (INDEPENDENT_AMBULATORY_CARE_PROVIDER_SITE_OTHER): Admitting: Physical Therapy

## 2024-04-04 DIAGNOSIS — R29898 Other symptoms and signs involving the musculoskeletal system: Secondary | ICD-10-CM

## 2024-04-04 DIAGNOSIS — M25571 Pain in right ankle and joints of right foot: Secondary | ICD-10-CM

## 2024-04-04 NOTE — Therapy (Addendum)
 OUTPATIENT PHYSICAL THERAPY TREATMENT RECERTIFICATION DISCHARGE SUMMARY    Patient Name: Barbara Glenn MRN: [id number] DOB:1961-05-24, 63 y.o., female Today's Date: 04/04/2024  END OF SESSION:  PT End of Session - 04/04/24 1510     Visit Number 3    Number of Visits 6    Date for PT Re-Evaluation 05/16/24    Authorization Type [employer] 30% coinsurance; 30 visit limit    Progress Note Due on Visit 10    PT Start Time 1511    PT Stop Time 1549    PT Time Calculation (min) 38 min    Activity Tolerance Patient tolerated treatment well                       Past Medical History:  Diagnosis Date   Hyperlipidemia    Hypertension    Past Surgical History:  Procedure Laterality Date   DILATION AND CURETTAGE OF UTERUS     ENDOMETRIAL ABLATION     PLACEMENT OF BREAST IMPLANTS     There are no active problems to display for this patient.   PCP: Pcp, No  REFERRING PROVIDER: Burnetta Brunet, DO  REFERRING DIAG: [phone] (ICD-10-CM) - Achilles tendinosis of right lower extremity  Rationale for Evaluation and Treatment: Rehabilitation  THERAPY DIAG:  Pain in right ankle and joints of right foot - Plan: PT plan of care cert/re-cert  Other symptoms and signs involving the musculoskeletal system - Plan: PT plan of care cert/re-cert  ONSET DATE: Sept 2023   SUBJECTIVE:                                                                                                                                                                                           SUBJECTIVE STATEMENT: Went to [place] and didn't seem to have any difficulty walking there.  Symptoms are not 100% better, but they are definitely better.    PERTINENT HISTORY:  HLD, HTN  PAIN:  Are you having pain? Yes: NPRS scale: 0-2/10 Pain location: Rt heel/achilles Pain description: stretching, pulling Aggravating factors: [place]; worse in [place] Relieving factors: rest, shockwave  therapy  PRECAUTIONS:  None  RED FLAGS: None   WEIGHT BEARING RESTRICTIONS:  No  FALLS:  Has patient fallen in last 6 months? 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 Moderate tendinosis of the Achilles tendon without a tear and with subcortical bone marrow edema at the Achilles insertion. Mild retrocalcaneal bursitis. 2. Moderate tendinosis of the peroneus brevis with a small partial-thickness tear just distal to the lateral malleolus.  PATIENT SURVEYS:  12/15/23 Patient-specific activity scoring scheme   0 represents "unable to perform." 10 represents "able to perform at prior level. 0 1 2 3 4 5 6 7 8 9  10 (Date and Score) Activity Initial  Activity Eval   04/04/24  Walking  4  7  Score 4 7   Total score = sum of the activity scores/number of activities Minimum detectable change (90%CI) for average score = 2 points Minimum detectable change (90%CI) for single activity score = 3 points   COGNITIVE STATUS: Within functional limits for tasks assessed   SENSATION: WFL  POSTURE:  rounded shoulders and forward head  GAIT: 06/19/23 Comments: mild antalgic gait   PALPATION: Trigger points noted in Rt soleus  LOWER EXTREMITY ROM:     ROM Right 06/19/23 Right 12/15/23  Ankle dorsiflexion A: 5 P:10 A: 8  Ankle plantarflexion A: 59 A: 51  Ankle inversion A: 22 A: 22  Ankle eversion A: 13 A: 14   (Blank rows = not tested)   LOWER EXTREMITY MMT:    MMT Right 06/19/23 Right 12/15/23 Right 04/04/24  Ankle dorsiflexion 5/5 5/5 5/5  Ankle  plantarflexion 4/5 2/5 3/5  Ankle inversion 5/5 5/5 5/5  Ankle eversion 4/5 5/5 5/5   (Blank rows = not tested)    FUNCTIONAL TESTS:  06/19/23: SLS: RLE: 5 sec; LLE: 7 sec 12/15/23: SLS: Rt: 5 sec; Lt: 3 sec 04/04/24: SLS: RLE: 7.44 sec   TREATMENT:                                                                                                                              DATE:  04/04/24 TherEx Review/modifications to HEP  with pt verbalizing understanding Added aquatic exercises to HEP and encouraged she try to utilize the pool to work on these MMT assessment - see above for details  Self Care Review of acetic acid iontophoresis protocol with pt and to try again if interested; also discussed PRP injection and recommended she discuss with Dr. Burnetta about whether a 2nd injection would be helpful; pt plans to wait 2-3 more weeks to see Review of goals and current progress with pt  01/22/24 TherEx Review/modifications to HEP  with pt verbalizing understanding Added additional exercises for pt with pt demonstrating return understanding including lateral hopping, bouncing/jumping based on tolerance, and sustained calf hold on RLE x 15 sec.  Self Care Review of acetic acid iontophoresis protocol with pt and to try again if interested  12/15/23 See HEP - reviewed prior HEP and trial reps of new exercises performed,  min cues for technique    PATIENT EDUCATION:  Education details: HEP Person educated: Patient Education method: Explanation, Demonstration, and Handouts Education comprehension: verbalized understanding, returned demonstration, and needs further education  HOME EXERCISE PROGRAM: Access Code: [email or web address] URL: [email or web address] Date: 01/22/2024 Prepared by: Corean Ku  Exercises - Seated Ankle Eversion with Resistance  - 1-2 x daily - 7 x weekly - 1 sets - 20 reps - Ankle and Toe Plantarflexion with Resistance  - 1-2 x daily - 7 x weekly - 1 sets -  20 reps - Standing Eccentric Heel Raise  - 1-2 x daily - 7 x weekly - 1-2 sets - 10 reps - Standing Heel Raise with Band  - 1-2 x daily - 7 x weekly - 1-2 sets - 10 reps - Gastroc Stretch on Wall  - 2 x daily - 7 x weekly - 1 sets - 3 reps - 30 sec hold - Soleus Stretch on Wall  - 2 x daily - 7 x weekly - 1 sets - 3 reps - 30 sec hold - Single Leg Stance with Support  - 1 x daily - 7 x weekly - 1 sets - 5 reps - 10- 15 sec hold - Single Leg Stance on Foam Pad  - 1 x daily - 7 x weekly - 1 sets - 5 reps - 10-15 hold - Lateral Hopping on Level Ground  - 1 x daily - 7 x weekly - 1 sets - 5 reps - 15 seconds - Jumping Rope  - 1 x daily - 7 x weekly - 3 sets - 10 reps - Isometric Heel Raise at Wall  - 1 x daily - 7 x weekly - 3 sets - 10 reps - 10-15 sec hold   Access Code: [email or web address] URL: [email or web address] Date: 04/04/2024 Prepared by: Corean Ku  Exercises - Heel Toe Raises at [facility]  - 1 x daily - 7 x weekly - 3 sets - 10 reps - Forward Toe Walk with Hand Floats  - 1 x daily - 7 x weekly - 3 sets - 10 reps - Backward Toe Walk with Hand Floats  - 1 x daily - 7 x weekly - 3 sets - 10 reps   ASSESSMENT:  CLINICAL IMPRESSION: Pt has met 1 LTG at this time and now improving symptoms and progress towards other LTGs.  Feel she is progressing well and added aquatic exercises to help with calf strengthening.  Continue skilled PT.    OBJECTIVE IMPAIRMENTS: Abnormal gait, decreased balance, decreased mobility, difficulty walking, decreased strength, increased fascial restrictions, increased muscle spasms, and pain.   ACTIVITY LIMITATIONS: carrying, lifting, squatting, stairs, and locomotion level  PARTICIPATION LIMITATIONS: meal prep, cleaning, laundry, driving, shopping, and community activity  PERSONAL FACTORS: 1-2 comorbidities: HTN, HLD are also affecting patient's functional outcome.   REHAB POTENTIAL: Good  CLINICAL DECISION MAKING: Evolving/moderate  complexity  EVALUATION COMPLEXITY: Moderate   GOALS: Goals reviewed with patient? Yes  SHORT TERM GOALS: Target date: 01/05/2024  Independent with initial HEP Goal status: INITIAL 12/15/23   LONG TERM GOALS: Target date: 05/02/2024  Independent with final HEP Goal status: ONGOING (added aquatic exercises) 04/04/24  2.  FOTO score improved to 66 Goal status: DEFERRED (FOTO not longer used) 04/04/24  3.  Rt ankle strength improved to 5/5 for improved function and mobility Goal status: ONGOING 04/04/24  4.  Report pain < 3/10 with stairs and walking inclines for improved function Goal status: MET 04/04/24  5.  Perform bil  SLS to at least 10 sec for improved stability and balance Goal status: ONGOING 04/04/24    PLAN:  PT FREQUENCY: 1x/week, plan to see PRN, will see up to 1x/wk if needed  PT DURATION: 6 weeks  PLANNED INTERVENTIONS: Therapeutic exercises, Therapeutic activity, Neuromuscular re-education, Balance training, Gait training, Patient/Family education, Self Care, Joint mobilization, Joint manipulation, Stair training, Vestibular training, Canalith repositioning, Aquatic Therapy, Dry Needling, Electrical stimulation, Cryotherapy, Moist heat, Taping, Vasopneumatic device, Ultrasound, Ionotophoresis 4mg /ml Dexamethasone, Manual therapy, Re-evaluation, and iontophoresis with acetic acid.  PLAN FOR NEXT SESSION:  did she resume strength training, manual/modalities/DN PRN   NEXT MD VISIT: PRN  Corean JULIANNA Ku, PT, DPT 04/04/24 3:53 PM     PHYSICAL THERAPY DISCHARGE SUMMARY  Visits from Start of Care: 3  Current functional level related to goals / functional outcomes: See above   Remaining deficits: See above   Education / Equipment: HEP, DN, acetic acid iontophoresis   Patient agrees to discharge. Patient goals were partially met. Patient is being discharged due to not returning since the last visit.  Corean JULIANNA Ku, PT, DPT 06/29/24 10:14 AM

## 2024-04-07 DIAGNOSIS — J3089 Other allergic rhinitis: Secondary | ICD-10-CM | POA: Diagnosis not present

## 2024-04-15 DIAGNOSIS — Z713 Dietary counseling and surveillance: Secondary | ICD-10-CM | POA: Diagnosis not present

## 2024-04-15 DIAGNOSIS — G4733 Obstructive sleep apnea (adult) (pediatric): Secondary | ICD-10-CM | POA: Diagnosis not present

## 2024-04-15 DIAGNOSIS — Z6828 Body mass index (BMI) 28.0-28.9, adult: Secondary | ICD-10-CM | POA: Diagnosis not present

## 2024-04-15 DIAGNOSIS — E669 Obesity, unspecified: Secondary | ICD-10-CM | POA: Diagnosis not present

## 2024-04-18 DIAGNOSIS — E782 Mixed hyperlipidemia: Secondary | ICD-10-CM | POA: Diagnosis not present

## 2024-04-18 DIAGNOSIS — J301 Allergic rhinitis due to pollen: Secondary | ICD-10-CM | POA: Diagnosis not present

## 2024-04-18 DIAGNOSIS — J3081 Allergic rhinitis due to animal (cat) (dog) hair and dander: Secondary | ICD-10-CM | POA: Diagnosis not present

## 2024-04-18 DIAGNOSIS — J3089 Other allergic rhinitis: Secondary | ICD-10-CM | POA: Diagnosis not present

## 2024-04-18 DIAGNOSIS — F411 Generalized anxiety disorder: Secondary | ICD-10-CM | POA: Diagnosis not present

## 2024-04-18 DIAGNOSIS — J309 Allergic rhinitis, unspecified: Secondary | ICD-10-CM | POA: Diagnosis not present

## 2024-04-19 ENCOUNTER — Other Ambulatory Visit: Payer: Self-pay | Admitting: Physician Assistant

## 2024-04-19 DIAGNOSIS — R7989 Other specified abnormal findings of blood chemistry: Secondary | ICD-10-CM

## 2024-04-19 DIAGNOSIS — Z0000 Encounter for general adult medical examination without abnormal findings: Secondary | ICD-10-CM | POA: Diagnosis not present

## 2024-04-19 DIAGNOSIS — Z13220 Encounter for screening for lipoid disorders: Secondary | ICD-10-CM | POA: Diagnosis not present

## 2024-04-20 ENCOUNTER — Telehealth: Payer: Self-pay

## 2024-04-20 ENCOUNTER — Inpatient Hospital Stay: Admitting: Physician Assistant

## 2024-04-20 ENCOUNTER — Inpatient Hospital Stay: Attending: Hematology and Oncology

## 2024-04-20 NOTE — Telephone Encounter (Signed)
 LM for pt to call the office to reschedule her No Show appt for today.

## 2024-04-25 ENCOUNTER — Encounter: Admitting: Physical Therapy

## 2024-04-25 DIAGNOSIS — Z1211 Encounter for screening for malignant neoplasm of colon: Secondary | ICD-10-CM | POA: Diagnosis not present

## 2024-04-25 DIAGNOSIS — Z01411 Encounter for gynecological examination (general) (routine) with abnormal findings: Secondary | ICD-10-CM | POA: Diagnosis not present

## 2024-04-25 DIAGNOSIS — J301 Allergic rhinitis due to pollen: Secondary | ICD-10-CM | POA: Diagnosis not present

## 2024-04-25 DIAGNOSIS — J3089 Other allergic rhinitis: Secondary | ICD-10-CM | POA: Diagnosis not present

## 2024-04-25 DIAGNOSIS — Z23 Encounter for immunization: Secondary | ICD-10-CM | POA: Diagnosis not present

## 2024-04-25 DIAGNOSIS — J3081 Allergic rhinitis due to animal (cat) (dog) hair and dander: Secondary | ICD-10-CM | POA: Diagnosis not present

## 2024-04-25 DIAGNOSIS — Z01419 Encounter for gynecological examination (general) (routine) without abnormal findings: Secondary | ICD-10-CM | POA: Diagnosis not present

## 2024-04-25 DIAGNOSIS — Z0000 Encounter for general adult medical examination without abnormal findings: Secondary | ICD-10-CM | POA: Diagnosis not present

## 2024-04-27 DIAGNOSIS — G4733 Obstructive sleep apnea (adult) (pediatric): Secondary | ICD-10-CM | POA: Diagnosis not present

## 2024-04-28 DIAGNOSIS — G4733 Obstructive sleep apnea (adult) (pediatric): Secondary | ICD-10-CM | POA: Diagnosis not present

## 2024-05-09 DIAGNOSIS — Z713 Dietary counseling and surveillance: Secondary | ICD-10-CM | POA: Diagnosis not present

## 2024-05-09 DIAGNOSIS — J3081 Allergic rhinitis due to animal (cat) (dog) hair and dander: Secondary | ICD-10-CM | POA: Diagnosis not present

## 2024-05-09 DIAGNOSIS — J301 Allergic rhinitis due to pollen: Secondary | ICD-10-CM | POA: Diagnosis not present

## 2024-05-09 DIAGNOSIS — G4733 Obstructive sleep apnea (adult) (pediatric): Secondary | ICD-10-CM | POA: Diagnosis not present

## 2024-05-09 DIAGNOSIS — J3089 Other allergic rhinitis: Secondary | ICD-10-CM | POA: Diagnosis not present

## 2024-05-09 DIAGNOSIS — E669 Obesity, unspecified: Secondary | ICD-10-CM | POA: Diagnosis not present

## 2024-05-17 DIAGNOSIS — J3089 Other allergic rhinitis: Secondary | ICD-10-CM | POA: Diagnosis not present

## 2024-05-18 DIAGNOSIS — G4700 Insomnia, unspecified: Secondary | ICD-10-CM | POA: Diagnosis not present

## 2024-05-18 DIAGNOSIS — R5383 Other fatigue: Secondary | ICD-10-CM | POA: Diagnosis not present

## 2024-05-18 DIAGNOSIS — F411 Generalized anxiety disorder: Secondary | ICD-10-CM | POA: Diagnosis not present

## 2024-05-27 DIAGNOSIS — J3081 Allergic rhinitis due to animal (cat) (dog) hair and dander: Secondary | ICD-10-CM | POA: Diagnosis not present

## 2024-05-27 DIAGNOSIS — J301 Allergic rhinitis due to pollen: Secondary | ICD-10-CM | POA: Diagnosis not present

## 2024-05-27 DIAGNOSIS — J3089 Other allergic rhinitis: Secondary | ICD-10-CM | POA: Diagnosis not present

## 2024-05-29 DIAGNOSIS — G4733 Obstructive sleep apnea (adult) (pediatric): Secondary | ICD-10-CM | POA: Diagnosis not present

## 2024-06-06 DIAGNOSIS — J3081 Allergic rhinitis due to animal (cat) (dog) hair and dander: Secondary | ICD-10-CM | POA: Diagnosis not present

## 2024-06-06 DIAGNOSIS — J3089 Other allergic rhinitis: Secondary | ICD-10-CM | POA: Diagnosis not present

## 2024-06-06 DIAGNOSIS — J301 Allergic rhinitis due to pollen: Secondary | ICD-10-CM | POA: Diagnosis not present

## 2024-06-17 DIAGNOSIS — J301 Allergic rhinitis due to pollen: Secondary | ICD-10-CM | POA: Diagnosis not present

## 2024-06-17 DIAGNOSIS — J3089 Other allergic rhinitis: Secondary | ICD-10-CM | POA: Diagnosis not present

## 2024-06-17 DIAGNOSIS — J3081 Allergic rhinitis due to animal (cat) (dog) hair and dander: Secondary | ICD-10-CM | POA: Diagnosis not present

## 2024-06-22 DIAGNOSIS — G4733 Obstructive sleep apnea (adult) (pediatric): Secondary | ICD-10-CM | POA: Diagnosis not present

## 2024-06-22 DIAGNOSIS — E669 Obesity, unspecified: Secondary | ICD-10-CM | POA: Diagnosis not present

## 2024-06-24 DIAGNOSIS — G4733 Obstructive sleep apnea (adult) (pediatric): Secondary | ICD-10-CM | POA: Diagnosis not present

## 2024-06-24 DIAGNOSIS — J301 Allergic rhinitis due to pollen: Secondary | ICD-10-CM | POA: Diagnosis not present

## 2024-06-24 DIAGNOSIS — J3081 Allergic rhinitis due to animal (cat) (dog) hair and dander: Secondary | ICD-10-CM | POA: Diagnosis not present

## 2024-06-24 DIAGNOSIS — J3089 Other allergic rhinitis: Secondary | ICD-10-CM | POA: Diagnosis not present

## 2024-06-29 DIAGNOSIS — G4731 Primary central sleep apnea: Secondary | ICD-10-CM | POA: Diagnosis not present

## 2024-06-29 DIAGNOSIS — F411 Generalized anxiety disorder: Secondary | ICD-10-CM | POA: Diagnosis not present

## 2024-06-29 DIAGNOSIS — I10 Essential (primary) hypertension: Secondary | ICD-10-CM | POA: Diagnosis not present

## 2024-06-29 DIAGNOSIS — Z23 Encounter for immunization: Secondary | ICD-10-CM | POA: Diagnosis not present

## 2024-06-29 DIAGNOSIS — F331 Major depressive disorder, recurrent, moderate: Secondary | ICD-10-CM | POA: Diagnosis not present

## 2024-06-29 DIAGNOSIS — G4700 Insomnia, unspecified: Secondary | ICD-10-CM | POA: Diagnosis not present

## 2024-07-05 DIAGNOSIS — J301 Allergic rhinitis due to pollen: Secondary | ICD-10-CM | POA: Diagnosis not present

## 2024-07-05 DIAGNOSIS — Z1211 Encounter for screening for malignant neoplasm of colon: Secondary | ICD-10-CM | POA: Diagnosis not present

## 2024-07-05 DIAGNOSIS — J3081 Allergic rhinitis due to animal (cat) (dog) hair and dander: Secondary | ICD-10-CM | POA: Diagnosis not present

## 2024-07-05 DIAGNOSIS — E785 Hyperlipidemia, unspecified: Secondary | ICD-10-CM | POA: Diagnosis not present

## 2024-07-05 DIAGNOSIS — I10 Essential (primary) hypertension: Secondary | ICD-10-CM | POA: Diagnosis not present

## 2024-07-05 DIAGNOSIS — J3089 Other allergic rhinitis: Secondary | ICD-10-CM | POA: Diagnosis not present

## 2024-07-11 DIAGNOSIS — K080 Exfoliation of teeth due to systemic causes: Secondary | ICD-10-CM | POA: Diagnosis not present

## 2024-07-15 DIAGNOSIS — J3081 Allergic rhinitis due to animal (cat) (dog) hair and dander: Secondary | ICD-10-CM | POA: Diagnosis not present

## 2024-07-15 DIAGNOSIS — J3089 Other allergic rhinitis: Secondary | ICD-10-CM | POA: Diagnosis not present

## 2024-07-15 DIAGNOSIS — J301 Allergic rhinitis due to pollen: Secondary | ICD-10-CM | POA: Diagnosis not present

## 2024-07-25 DIAGNOSIS — G4733 Obstructive sleep apnea (adult) (pediatric): Secondary | ICD-10-CM | POA: Diagnosis not present

## 2024-07-25 DIAGNOSIS — L812 Freckles: Secondary | ICD-10-CM | POA: Diagnosis not present

## 2024-07-25 DIAGNOSIS — L821 Other seborrheic keratosis: Secondary | ICD-10-CM | POA: Diagnosis not present

## 2024-07-25 DIAGNOSIS — Z85820 Personal history of malignant melanoma of skin: Secondary | ICD-10-CM | POA: Diagnosis not present

## 2024-07-25 DIAGNOSIS — L920 Granuloma annulare: Secondary | ICD-10-CM | POA: Diagnosis not present

## 2024-08-01 DIAGNOSIS — J3081 Allergic rhinitis due to animal (cat) (dog) hair and dander: Secondary | ICD-10-CM | POA: Diagnosis not present

## 2024-08-01 DIAGNOSIS — J3089 Other allergic rhinitis: Secondary | ICD-10-CM | POA: Diagnosis not present

## 2024-08-01 DIAGNOSIS — J301 Allergic rhinitis due to pollen: Secondary | ICD-10-CM | POA: Diagnosis not present

## 2024-08-05 DIAGNOSIS — I959 Hypotension, unspecified: Secondary | ICD-10-CM | POA: Diagnosis not present

## 2024-08-05 DIAGNOSIS — E860 Dehydration: Secondary | ICD-10-CM | POA: Diagnosis not present

## 2024-08-05 DIAGNOSIS — R42 Dizziness and giddiness: Secondary | ICD-10-CM | POA: Diagnosis not present

## 2024-08-05 DIAGNOSIS — R55 Syncope and collapse: Secondary | ICD-10-CM | POA: Diagnosis not present

## 2024-08-15 DIAGNOSIS — J3081 Allergic rhinitis due to animal (cat) (dog) hair and dander: Secondary | ICD-10-CM | POA: Diagnosis not present

## 2024-08-15 DIAGNOSIS — J3089 Other allergic rhinitis: Secondary | ICD-10-CM | POA: Diagnosis not present

## 2024-08-15 DIAGNOSIS — J301 Allergic rhinitis due to pollen: Secondary | ICD-10-CM | POA: Diagnosis not present

## 2024-08-16 DIAGNOSIS — J3081 Allergic rhinitis due to animal (cat) (dog) hair and dander: Secondary | ICD-10-CM | POA: Diagnosis not present

## 2024-08-16 DIAGNOSIS — I10 Essential (primary) hypertension: Secondary | ICD-10-CM | POA: Diagnosis not present

## 2024-08-16 DIAGNOSIS — F331 Major depressive disorder, recurrent, moderate: Secondary | ICD-10-CM | POA: Diagnosis not present

## 2024-08-16 DIAGNOSIS — Z23 Encounter for immunization: Secondary | ICD-10-CM | POA: Diagnosis not present

## 2024-08-16 DIAGNOSIS — J3089 Other allergic rhinitis: Secondary | ICD-10-CM | POA: Diagnosis not present

## 2024-08-16 DIAGNOSIS — F411 Generalized anxiety disorder: Secondary | ICD-10-CM | POA: Diagnosis not present

## 2024-08-16 DIAGNOSIS — G4700 Insomnia, unspecified: Secondary | ICD-10-CM | POA: Diagnosis not present

## 2024-08-16 DIAGNOSIS — Z7185 Encounter for immunization safety counseling: Secondary | ICD-10-CM | POA: Diagnosis not present

## 2024-08-16 DIAGNOSIS — J301 Allergic rhinitis due to pollen: Secondary | ICD-10-CM | POA: Diagnosis not present

## 2024-08-26 DIAGNOSIS — J3081 Allergic rhinitis due to animal (cat) (dog) hair and dander: Secondary | ICD-10-CM | POA: Diagnosis not present

## 2024-08-26 DIAGNOSIS — J301 Allergic rhinitis due to pollen: Secondary | ICD-10-CM | POA: Diagnosis not present

## 2024-08-26 DIAGNOSIS — J3089 Other allergic rhinitis: Secondary | ICD-10-CM | POA: Diagnosis not present

## 2024-08-29 ENCOUNTER — Encounter: Payer: Self-pay | Admitting: Radiology

## 2024-09-07 DIAGNOSIS — H60509 Unspecified acute noninfective otitis externa, unspecified ear: Secondary | ICD-10-CM | POA: Diagnosis not present

## 2024-09-10 DIAGNOSIS — H66002 Acute suppurative otitis media without spontaneous rupture of ear drum, left ear: Secondary | ICD-10-CM | POA: Diagnosis not present

## 2024-09-19 DIAGNOSIS — J3081 Allergic rhinitis due to animal (cat) (dog) hair and dander: Secondary | ICD-10-CM | POA: Diagnosis not present

## 2024-09-19 DIAGNOSIS — R21 Rash and other nonspecific skin eruption: Secondary | ICD-10-CM | POA: Diagnosis not present

## 2024-09-19 DIAGNOSIS — J301 Allergic rhinitis due to pollen: Secondary | ICD-10-CM | POA: Diagnosis not present

## 2024-09-19 DIAGNOSIS — J3089 Other allergic rhinitis: Secondary | ICD-10-CM | POA: Diagnosis not present

## 2024-09-26 DIAGNOSIS — F411 Generalized anxiety disorder: Secondary | ICD-10-CM | POA: Diagnosis not present

## 2024-09-26 DIAGNOSIS — I10 Essential (primary) hypertension: Secondary | ICD-10-CM | POA: Diagnosis not present

## 2024-10-04 ENCOUNTER — Telehealth: Payer: Self-pay | Admitting: Sports Medicine

## 2024-10-04 DIAGNOSIS — J301 Allergic rhinitis due to pollen: Secondary | ICD-10-CM | POA: Diagnosis not present

## 2024-10-04 DIAGNOSIS — J3089 Other allergic rhinitis: Secondary | ICD-10-CM | POA: Diagnosis not present

## 2024-10-04 DIAGNOSIS — J3081 Allergic rhinitis due to animal (cat) (dog) hair and dander: Secondary | ICD-10-CM | POA: Diagnosis not present

## 2024-10-04 NOTE — Telephone Encounter (Signed)
 Patient called. She would like a PRP.

## 2024-10-05 NOTE — Telephone Encounter (Signed)
 Patient scheduled for follow-up and PRP discussion on 10/17/2024.

## 2024-10-17 ENCOUNTER — Ambulatory Visit: Admitting: Sports Medicine

## 2024-10-17 ENCOUNTER — Encounter: Payer: Self-pay | Admitting: Sports Medicine

## 2024-10-17 DIAGNOSIS — M7660 Achilles tendinitis, unspecified leg: Secondary | ICD-10-CM | POA: Diagnosis not present

## 2024-10-17 DIAGNOSIS — M7731 Calcaneal spur, right foot: Secondary | ICD-10-CM | POA: Diagnosis not present

## 2024-10-17 DIAGNOSIS — M6788 Other specified disorders of synovium and tendon, other site: Secondary | ICD-10-CM | POA: Diagnosis not present

## 2024-10-17 NOTE — Progress Notes (Signed)
 Patient says that her achilles feels about 70% improved overall. She does still have some stiffness in the achilles, but overall has noticed improvements. She is inquiring about repeating the PRP injection, but is concerned that it will make her symptoms worse.

## 2024-10-17 NOTE — Progress Notes (Signed)
 "  Barbara Glenn - 63 y.o. female MRN [id number]  Date of birth: 21-Apr-1961  Office Visit Note: Visit Date: 10/17/2024 PCP: Pcp, No Referred by: No ref. provider found  Subjective: Chief Complaint  Patient presents with   Right Heel - Follow-up, Pain   HPI: Barbara Glenn is a pleasant 64 y.o. female who presents today for follow-up of achilles tendinopathy.  Barbara Glenn states that her achilles feels about 70% improved overall.  We did perform ultrasound-guided PRP injection into the Achilles tendon back on 11/16/2023.  It did take several months but overall felt like she improved quite a bit.  During this time she also has lost about 25 pounds which has helped offload this in her joints in general.  She does still have some stiffness in the achilles, but overall has noticed improvements. She is inquiring about repeating the PRP injection.  Her husband also notes improvement but does not feel she has made as significant improvement.  Does note that she still takes the steps somewhat gingerly.  Pertinent ROS were reviewed with the patient and found to be negative unless otherwise specified above in HPI.   Assessment & Plan: Visit Diagnoses:  1. Achilles tendinosis of right lower extremity   2. Pain in Achilles tendon   3. Calcaneal spur of right foot    Plan: Impression is chronic but improved Achilles tendinosis with insertional calcaneal heel spur.  Back on 11/16/2023 we did proceed with ultrasound-guided PRP injection for the Achilles which took a number of months but overall she has made significant improvement in terms of her pain and function, she has lost weight during this time which also is likely a degree of her relief.  We had a long discussion regarding the overall pathophysiology of PRP as well as potentially proceeding with additional for further full benefit.  At this point she is agreeable to proceeding, we will let her schedule this at her leisure.  I did provide my PRP handout as well  as described pre and postinjection protocol including but not limited to avoiding NSAIDs 10 days before and 2 weeks prior.  We also will place her back into her cam walker boot x 10 days consistently, and then transition out fully at 2-week mark before beginning PT at that time.  She may message/call with any questions in the interim.  Follow-up: Return for Schedule for US -guided PRP R-achilles  Meds & Orders: No orders of the defined types were placed in this encounter.  No orders of the defined types were placed in this encounter.    Procedures: No procedures performed      Clinical History: No specialty comments available.  She reports that she has never smoked. She has never used smokeless tobacco. No results for input(s): HGBA1C, LABURIC in the last 8760 hours.  Objective:    Physical Exam  Gen: Well-appearing, in no acute distress; non-toxic CV: Well-perfused. Warm.  Resp: Breathing unlabored on room air; no wheezing. Psych: Fluid speech in conversation; appropriate affect; normal thought process  Ortho Exam - Right foot/ankle: There is a mild degree of thickening over the mid to distal portion of the Achilles tendon.  There is an intact tendon with appropriate plantarflexion and dorsiflexion.  Range of motion is relatively well-preserved at the ankle joint.  Imaging:  MR Ankle Right w/o contrast CLINICAL DATA:  Right ankle pain  EXAM: MRI OF THE RIGHT ANKLE WITHOUT CONTRAST  TECHNIQUE: Multiplanar, multisequence MR imaging of the ankle was performed. No  intravenous contrast was administered.  COMPARISON:  None Available.  FINDINGS: TENDONS  Peroneal: Peroneal longus tendon intact. Moderate tendinosis of the peroneus brevis with a small partial-thickness tear just distal to the lateral malleolus.  Posteromedial: Posterior tibial tendon intact. Flexor hallucis longus tendon intact. Flexor digitorum longus tendon intact.  Anterior: Tibialis anterior tendon  intact. Extensor hallucis longus tendon intact Extensor digitorum longus tendon intact.  Achilles: Moderate tendinosis of the Achilles tendon without a tear. Subcortical bone marrow edema at the Achilles insertion. Small amount of fluid in the retrocalcaneal bursa.  Plantar Fascia: Intact. Small plantar calcaneal spur.  LIGAMENTS  Lateral: Anterior talofibular ligament intact. Calcaneofibular ligament intact. Posterior talofibular ligament intact. Anterior and posterior tibiofibular ligaments intact.  Medial: Deltoid ligament intact. Spring ligament intact.  CARTILAGE  Ankle Joint: No joint effusion. Normal ankle mortise. No focal chondral defect. Mild subchondral reactive marrow changes focally in the medial corner of the talar dome.  Subtalar Joints/Sinus Tarsi: Normal subtalar joints. No subtalar joint effusion. Normal sinus tarsi.  Bones: No aggressive osseous lesion. No fracture or dislocation.  Soft Tissue: No fluid collection or hematoma. Muscles are normal without edema or atrophy. Tarsal tunnel is normal.  IMPRESSION: 1. Moderate tendinosis of the Achilles tendon without a tear and with subcortical bone marrow edema at the Achilles insertion. Mild retrocalcaneal bursitis. 2. Moderate tendinosis of the peroneus brevis with a small partial-thickness tear just distal to the lateral malleolus.  Electronically Signed   By: Julaine Blanch M.D.   On: 05/24/2023 13:17  Past Medical/Family/Surgical/Social History: Medications & Allergies reviewed per EMR, new medications updated. There are no active problems to display for this patient.  Past Medical History:  Diagnosis Date   Hyperlipidemia    Hypertension    History reviewed. No pertinent family history. Past Surgical History:  Procedure Laterality Date   DILATION AND CURETTAGE OF UTERUS     ENDOMETRIAL ABLATION     PLACEMENT OF BREAST IMPLANTS     Social History   Occupational History   Not on file   Tobacco Use   Smoking status: Never   Smokeless tobacco: Never  Substance and Sexual Activity   Alcohol use: Yes    Alcohol/week: 6.0 standard drinks of alcohol    Types: 6 Cans of beer per week   Drug use: Never   Sexual activity: Not on file   I spent 38 minutes in the care of the patient today including face-to-face time, preparation to see the patient, as well as gait analysis, review of previous MRI imaging, discussion regarding PRP and overall pathophysiology as well as pre and postinjection protocol, time spent on the phone and conversation with patient and her husband Biomedical Scientist) for the above diagnoses.   Lonell Sprang, DO Primary Care Sports Medicine Physician  [facility]  This note was dictated using Dragon naturally speaking software and may contain errors in syntax, spelling, or content which have not been identified prior to signing this note.    "
